# Patient Record
Sex: Female | Born: 1945
Health system: Southern US, Community
[De-identification: ages and names within clinical notes are randomized; demographics above are authoritative.]

## PROBLEM LIST (undated history)

## (undated) DIAGNOSIS — I10 Essential (primary) hypertension: Secondary | ICD-10-CM

## (undated) DIAGNOSIS — T8859XA Other complications of anesthesia, initial encounter: Secondary | ICD-10-CM

## (undated) DIAGNOSIS — K759 Inflammatory liver disease, unspecified: Secondary | ICD-10-CM

## (undated) DIAGNOSIS — M1990 Unspecified osteoarthritis, unspecified site: Secondary | ICD-10-CM

## (undated) DIAGNOSIS — F419 Anxiety disorder, unspecified: Secondary | ICD-10-CM

## (undated) HISTORY — PX: EYE SURGERY: SHX253

## (undated) HISTORY — PX: TONSILLECTOMY: SUR1361

## (undated) HISTORY — PX: ABDOMINAL HYSTERECTOMY: SHX81

---

## 2000-01-10 ENCOUNTER — Other Ambulatory Visit: Admission: RE | Admit: 2000-01-10 | Discharge: 2000-01-10 | Payer: Self-pay | Admitting: Internal Medicine

## 2012-08-23 ENCOUNTER — Other Ambulatory Visit (HOSPITAL_COMMUNITY): Payer: Self-pay | Admitting: Family Medicine

## 2012-08-23 DIAGNOSIS — Z1231 Encounter for screening mammogram for malignant neoplasm of breast: Secondary | ICD-10-CM

## 2012-09-14 ENCOUNTER — Ambulatory Visit (HOSPITAL_COMMUNITY)
Admission: RE | Admit: 2012-09-14 | Discharge: 2012-09-14 | Disposition: A | Discharge status: Home / Self Care | Payer: Medicare Other | Source: Ambulatory Visit | Attending: Family Medicine | Admitting: Family Medicine

## 2012-09-14 DIAGNOSIS — Z1231 Encounter for screening mammogram for malignant neoplasm of breast: Secondary | ICD-10-CM | POA: Insufficient documentation

## 2014-10-30 ENCOUNTER — Other Ambulatory Visit (HOSPITAL_COMMUNITY): Payer: Self-pay | Admitting: Family Medicine

## 2014-10-30 DIAGNOSIS — M8580 Other specified disorders of bone density and structure, unspecified site: Secondary | ICD-10-CM

## 2014-10-30 DIAGNOSIS — Z1231 Encounter for screening mammogram for malignant neoplasm of breast: Secondary | ICD-10-CM

## 2014-10-30 DIAGNOSIS — E049 Nontoxic goiter, unspecified: Secondary | ICD-10-CM

## 2014-11-07 ENCOUNTER — Ambulatory Visit (HOSPITAL_COMMUNITY)
Admission: RE | Admit: 2014-11-07 | Discharge: 2014-11-07 | Disposition: A | Discharge status: Home / Self Care | Payer: Medicare Other | Source: Ambulatory Visit | Attending: Family Medicine | Admitting: Family Medicine

## 2014-11-07 DIAGNOSIS — Z1231 Encounter for screening mammogram for malignant neoplasm of breast: Secondary | ICD-10-CM

## 2014-11-07 DIAGNOSIS — M8580 Other specified disorders of bone density and structure, unspecified site: Secondary | ICD-10-CM | POA: Diagnosis not present

## 2014-11-07 DIAGNOSIS — E049 Nontoxic goiter, unspecified: Secondary | ICD-10-CM

## 2014-12-09 ENCOUNTER — Other Ambulatory Visit (INDEPENDENT_AMBULATORY_CARE_PROVIDER_SITE_OTHER): Payer: Self-pay

## 2014-12-09 DIAGNOSIS — E041 Nontoxic single thyroid nodule: Secondary | ICD-10-CM

## 2014-12-10 ENCOUNTER — Other Ambulatory Visit (INDEPENDENT_AMBULATORY_CARE_PROVIDER_SITE_OTHER): Payer: Self-pay

## 2014-12-10 DIAGNOSIS — E041 Nontoxic single thyroid nodule: Secondary | ICD-10-CM

## 2014-12-11 ENCOUNTER — Other Ambulatory Visit (INDEPENDENT_AMBULATORY_CARE_PROVIDER_SITE_OTHER): Payer: Self-pay | Admitting: *Deleted

## 2014-12-11 DIAGNOSIS — E041 Nontoxic single thyroid nodule: Secondary | ICD-10-CM

## 2014-12-24 ENCOUNTER — Ambulatory Visit
Admission: RE | Admit: 2014-12-24 | Discharge: 2014-12-24 | Disposition: A | Discharge status: Home / Self Care | Payer: Medicare Other | Source: Ambulatory Visit | Attending: General Surgery | Admitting: General Surgery

## 2014-12-24 ENCOUNTER — Ambulatory Visit: Admission: RE | Admit: 2014-12-24 | Payer: Medicare Other | Source: Ambulatory Visit

## 2014-12-24 ENCOUNTER — Other Ambulatory Visit (HOSPITAL_COMMUNITY)
Admission: RE | Admit: 2014-12-24 | Discharge: 2014-12-24 | Disposition: A | Discharge status: Home / Self Care | Payer: Medicare Other | Source: Ambulatory Visit | Attending: Interventional Radiology | Admitting: Interventional Radiology

## 2014-12-24 DIAGNOSIS — E041 Nontoxic single thyroid nodule: Secondary | ICD-10-CM | POA: Insufficient documentation

## 2015-03-02 ENCOUNTER — Other Ambulatory Visit (INDEPENDENT_AMBULATORY_CARE_PROVIDER_SITE_OTHER): Payer: Self-pay

## 2016-11-05 DIAGNOSIS — R69 Illness, unspecified: Secondary | ICD-10-CM | POA: Diagnosis not present

## 2017-01-16 DIAGNOSIS — M62830 Muscle spasm of back: Secondary | ICD-10-CM | POA: Diagnosis not present

## 2017-01-16 DIAGNOSIS — M545 Low back pain: Secondary | ICD-10-CM | POA: Diagnosis not present

## 2017-01-16 DIAGNOSIS — M5136 Other intervertebral disc degeneration, lumbar region: Secondary | ICD-10-CM | POA: Diagnosis not present

## 2017-01-16 DIAGNOSIS — M9903 Segmental and somatic dysfunction of lumbar region: Secondary | ICD-10-CM | POA: Diagnosis not present

## 2017-01-18 DIAGNOSIS — M545 Low back pain: Secondary | ICD-10-CM | POA: Diagnosis not present

## 2017-01-18 DIAGNOSIS — M5136 Other intervertebral disc degeneration, lumbar region: Secondary | ICD-10-CM | POA: Diagnosis not present

## 2017-01-18 DIAGNOSIS — M62830 Muscle spasm of back: Secondary | ICD-10-CM | POA: Diagnosis not present

## 2017-01-18 DIAGNOSIS — M9903 Segmental and somatic dysfunction of lumbar region: Secondary | ICD-10-CM | POA: Diagnosis not present

## 2017-01-19 DIAGNOSIS — M545 Low back pain: Secondary | ICD-10-CM | POA: Diagnosis not present

## 2017-01-19 DIAGNOSIS — M5136 Other intervertebral disc degeneration, lumbar region: Secondary | ICD-10-CM | POA: Diagnosis not present

## 2017-01-19 DIAGNOSIS — M62830 Muscle spasm of back: Secondary | ICD-10-CM | POA: Diagnosis not present

## 2017-01-19 DIAGNOSIS — M9903 Segmental and somatic dysfunction of lumbar region: Secondary | ICD-10-CM | POA: Diagnosis not present

## 2017-01-23 DIAGNOSIS — M5136 Other intervertebral disc degeneration, lumbar region: Secondary | ICD-10-CM | POA: Diagnosis not present

## 2017-01-23 DIAGNOSIS — M9903 Segmental and somatic dysfunction of lumbar region: Secondary | ICD-10-CM | POA: Diagnosis not present

## 2017-01-23 DIAGNOSIS — M62830 Muscle spasm of back: Secondary | ICD-10-CM | POA: Diagnosis not present

## 2017-01-23 DIAGNOSIS — M545 Low back pain: Secondary | ICD-10-CM | POA: Diagnosis not present

## 2017-01-25 DIAGNOSIS — M9903 Segmental and somatic dysfunction of lumbar region: Secondary | ICD-10-CM | POA: Diagnosis not present

## 2017-01-25 DIAGNOSIS — M5136 Other intervertebral disc degeneration, lumbar region: Secondary | ICD-10-CM | POA: Diagnosis not present

## 2017-01-25 DIAGNOSIS — M62830 Muscle spasm of back: Secondary | ICD-10-CM | POA: Diagnosis not present

## 2017-01-25 DIAGNOSIS — M545 Low back pain: Secondary | ICD-10-CM | POA: Diagnosis not present

## 2017-01-26 DIAGNOSIS — M9903 Segmental and somatic dysfunction of lumbar region: Secondary | ICD-10-CM | POA: Diagnosis not present

## 2017-01-26 DIAGNOSIS — M5136 Other intervertebral disc degeneration, lumbar region: Secondary | ICD-10-CM | POA: Diagnosis not present

## 2017-01-26 DIAGNOSIS — M62830 Muscle spasm of back: Secondary | ICD-10-CM | POA: Diagnosis not present

## 2017-01-26 DIAGNOSIS — M545 Low back pain: Secondary | ICD-10-CM | POA: Diagnosis not present

## 2017-01-30 DIAGNOSIS — M545 Low back pain: Secondary | ICD-10-CM | POA: Diagnosis not present

## 2017-01-30 DIAGNOSIS — M5136 Other intervertebral disc degeneration, lumbar region: Secondary | ICD-10-CM | POA: Diagnosis not present

## 2017-01-30 DIAGNOSIS — M9903 Segmental and somatic dysfunction of lumbar region: Secondary | ICD-10-CM | POA: Diagnosis not present

## 2017-01-30 DIAGNOSIS — M62830 Muscle spasm of back: Secondary | ICD-10-CM | POA: Diagnosis not present

## 2017-02-01 DIAGNOSIS — M9903 Segmental and somatic dysfunction of lumbar region: Secondary | ICD-10-CM | POA: Diagnosis not present

## 2017-02-01 DIAGNOSIS — M5136 Other intervertebral disc degeneration, lumbar region: Secondary | ICD-10-CM | POA: Diagnosis not present

## 2017-02-01 DIAGNOSIS — M545 Low back pain: Secondary | ICD-10-CM | POA: Diagnosis not present

## 2017-02-01 DIAGNOSIS — M62830 Muscle spasm of back: Secondary | ICD-10-CM | POA: Diagnosis not present

## 2017-02-02 DIAGNOSIS — M5136 Other intervertebral disc degeneration, lumbar region: Secondary | ICD-10-CM | POA: Diagnosis not present

## 2017-02-02 DIAGNOSIS — M545 Low back pain: Secondary | ICD-10-CM | POA: Diagnosis not present

## 2017-02-02 DIAGNOSIS — M62830 Muscle spasm of back: Secondary | ICD-10-CM | POA: Diagnosis not present

## 2017-02-02 DIAGNOSIS — M9903 Segmental and somatic dysfunction of lumbar region: Secondary | ICD-10-CM | POA: Diagnosis not present

## 2017-02-06 DIAGNOSIS — M9903 Segmental and somatic dysfunction of lumbar region: Secondary | ICD-10-CM | POA: Diagnosis not present

## 2017-02-06 DIAGNOSIS — M62830 Muscle spasm of back: Secondary | ICD-10-CM | POA: Diagnosis not present

## 2017-02-06 DIAGNOSIS — M5136 Other intervertebral disc degeneration, lumbar region: Secondary | ICD-10-CM | POA: Diagnosis not present

## 2017-02-06 DIAGNOSIS — M545 Low back pain: Secondary | ICD-10-CM | POA: Diagnosis not present

## 2017-02-09 DIAGNOSIS — M5136 Other intervertebral disc degeneration, lumbar region: Secondary | ICD-10-CM | POA: Diagnosis not present

## 2017-02-09 DIAGNOSIS — M545 Low back pain: Secondary | ICD-10-CM | POA: Diagnosis not present

## 2017-02-09 DIAGNOSIS — M62830 Muscle spasm of back: Secondary | ICD-10-CM | POA: Diagnosis not present

## 2017-02-09 DIAGNOSIS — M9903 Segmental and somatic dysfunction of lumbar region: Secondary | ICD-10-CM | POA: Diagnosis not present

## 2017-02-10 DIAGNOSIS — M791 Myalgia: Secondary | ICD-10-CM | POA: Diagnosis not present

## 2017-02-10 DIAGNOSIS — E042 Nontoxic multinodular goiter: Secondary | ICD-10-CM | POA: Diagnosis not present

## 2017-02-10 DIAGNOSIS — R69 Illness, unspecified: Secondary | ICD-10-CM | POA: Diagnosis not present

## 2017-02-10 DIAGNOSIS — Z6829 Body mass index (BMI) 29.0-29.9, adult: Secondary | ICD-10-CM | POA: Diagnosis not present

## 2017-02-10 DIAGNOSIS — M542 Cervicalgia: Secondary | ICD-10-CM | POA: Diagnosis not present

## 2017-02-10 DIAGNOSIS — I10 Essential (primary) hypertension: Secondary | ICD-10-CM | POA: Diagnosis not present

## 2017-02-10 DIAGNOSIS — Z0000 Encounter for general adult medical examination without abnormal findings: Secondary | ICD-10-CM | POA: Diagnosis not present

## 2017-02-10 DIAGNOSIS — Z79899 Other long term (current) drug therapy: Secondary | ICD-10-CM | POA: Diagnosis not present

## 2017-02-10 DIAGNOSIS — Z7982 Long term (current) use of aspirin: Secondary | ICD-10-CM | POA: Diagnosis not present

## 2017-02-10 DIAGNOSIS — M545 Low back pain: Secondary | ICD-10-CM | POA: Diagnosis not present

## 2017-02-11 DIAGNOSIS — M9903 Segmental and somatic dysfunction of lumbar region: Secondary | ICD-10-CM | POA: Diagnosis not present

## 2017-02-11 DIAGNOSIS — M5136 Other intervertebral disc degeneration, lumbar region: Secondary | ICD-10-CM | POA: Diagnosis not present

## 2017-02-11 DIAGNOSIS — M62830 Muscle spasm of back: Secondary | ICD-10-CM | POA: Diagnosis not present

## 2017-02-11 DIAGNOSIS — M545 Low back pain: Secondary | ICD-10-CM | POA: Diagnosis not present

## 2017-02-14 DIAGNOSIS — M545 Low back pain: Secondary | ICD-10-CM | POA: Diagnosis not present

## 2017-02-14 DIAGNOSIS — M9903 Segmental and somatic dysfunction of lumbar region: Secondary | ICD-10-CM | POA: Diagnosis not present

## 2017-02-14 DIAGNOSIS — M5136 Other intervertebral disc degeneration, lumbar region: Secondary | ICD-10-CM | POA: Diagnosis not present

## 2017-02-14 DIAGNOSIS — M62830 Muscle spasm of back: Secondary | ICD-10-CM | POA: Diagnosis not present

## 2017-02-16 DIAGNOSIS — M9903 Segmental and somatic dysfunction of lumbar region: Secondary | ICD-10-CM | POA: Diagnosis not present

## 2017-02-16 DIAGNOSIS — M545 Low back pain: Secondary | ICD-10-CM | POA: Diagnosis not present

## 2017-02-16 DIAGNOSIS — M62830 Muscle spasm of back: Secondary | ICD-10-CM | POA: Diagnosis not present

## 2017-02-16 DIAGNOSIS — M5136 Other intervertebral disc degeneration, lumbar region: Secondary | ICD-10-CM | POA: Diagnosis not present

## 2017-02-21 DIAGNOSIS — M545 Low back pain: Secondary | ICD-10-CM | POA: Diagnosis not present

## 2017-02-21 DIAGNOSIS — M5136 Other intervertebral disc degeneration, lumbar region: Secondary | ICD-10-CM | POA: Diagnosis not present

## 2017-02-21 DIAGNOSIS — M62830 Muscle spasm of back: Secondary | ICD-10-CM | POA: Diagnosis not present

## 2017-02-21 DIAGNOSIS — M9903 Segmental and somatic dysfunction of lumbar region: Secondary | ICD-10-CM | POA: Diagnosis not present

## 2017-02-23 DIAGNOSIS — M62830 Muscle spasm of back: Secondary | ICD-10-CM | POA: Diagnosis not present

## 2017-02-23 DIAGNOSIS — M545 Low back pain: Secondary | ICD-10-CM | POA: Diagnosis not present

## 2017-02-23 DIAGNOSIS — M9903 Segmental and somatic dysfunction of lumbar region: Secondary | ICD-10-CM | POA: Diagnosis not present

## 2017-02-23 DIAGNOSIS — M5136 Other intervertebral disc degeneration, lumbar region: Secondary | ICD-10-CM | POA: Diagnosis not present

## 2017-02-28 DIAGNOSIS — M62830 Muscle spasm of back: Secondary | ICD-10-CM | POA: Diagnosis not present

## 2017-02-28 DIAGNOSIS — M9903 Segmental and somatic dysfunction of lumbar region: Secondary | ICD-10-CM | POA: Diagnosis not present

## 2017-02-28 DIAGNOSIS — M5136 Other intervertebral disc degeneration, lumbar region: Secondary | ICD-10-CM | POA: Diagnosis not present

## 2017-02-28 DIAGNOSIS — M545 Low back pain: Secondary | ICD-10-CM | POA: Diagnosis not present

## 2017-03-02 DIAGNOSIS — M545 Low back pain: Secondary | ICD-10-CM | POA: Diagnosis not present

## 2017-03-02 DIAGNOSIS — M5136 Other intervertebral disc degeneration, lumbar region: Secondary | ICD-10-CM | POA: Diagnosis not present

## 2017-03-02 DIAGNOSIS — M62830 Muscle spasm of back: Secondary | ICD-10-CM | POA: Diagnosis not present

## 2017-03-02 DIAGNOSIS — M9903 Segmental and somatic dysfunction of lumbar region: Secondary | ICD-10-CM | POA: Diagnosis not present

## 2017-03-07 DIAGNOSIS — M62830 Muscle spasm of back: Secondary | ICD-10-CM | POA: Diagnosis not present

## 2017-03-07 DIAGNOSIS — M5136 Other intervertebral disc degeneration, lumbar region: Secondary | ICD-10-CM | POA: Diagnosis not present

## 2017-03-07 DIAGNOSIS — M545 Low back pain: Secondary | ICD-10-CM | POA: Diagnosis not present

## 2017-03-07 DIAGNOSIS — M9903 Segmental and somatic dysfunction of lumbar region: Secondary | ICD-10-CM | POA: Diagnosis not present

## 2017-03-09 DIAGNOSIS — M62830 Muscle spasm of back: Secondary | ICD-10-CM | POA: Diagnosis not present

## 2017-03-09 DIAGNOSIS — M545 Low back pain: Secondary | ICD-10-CM | POA: Diagnosis not present

## 2017-03-09 DIAGNOSIS — M5136 Other intervertebral disc degeneration, lumbar region: Secondary | ICD-10-CM | POA: Diagnosis not present

## 2017-03-09 DIAGNOSIS — M9903 Segmental and somatic dysfunction of lumbar region: Secondary | ICD-10-CM | POA: Diagnosis not present

## 2017-03-14 DIAGNOSIS — M9903 Segmental and somatic dysfunction of lumbar region: Secondary | ICD-10-CM | POA: Diagnosis not present

## 2017-03-14 DIAGNOSIS — M62830 Muscle spasm of back: Secondary | ICD-10-CM | POA: Diagnosis not present

## 2017-03-14 DIAGNOSIS — M545 Low back pain: Secondary | ICD-10-CM | POA: Diagnosis not present

## 2017-03-14 DIAGNOSIS — M5136 Other intervertebral disc degeneration, lumbar region: Secondary | ICD-10-CM | POA: Diagnosis not present

## 2017-03-16 DIAGNOSIS — M9903 Segmental and somatic dysfunction of lumbar region: Secondary | ICD-10-CM | POA: Diagnosis not present

## 2017-03-16 DIAGNOSIS — M545 Low back pain: Secondary | ICD-10-CM | POA: Diagnosis not present

## 2017-03-16 DIAGNOSIS — M62830 Muscle spasm of back: Secondary | ICD-10-CM | POA: Diagnosis not present

## 2017-03-16 DIAGNOSIS — M5136 Other intervertebral disc degeneration, lumbar region: Secondary | ICD-10-CM | POA: Diagnosis not present

## 2017-03-21 DIAGNOSIS — M5136 Other intervertebral disc degeneration, lumbar region: Secondary | ICD-10-CM | POA: Diagnosis not present

## 2017-03-21 DIAGNOSIS — M9903 Segmental and somatic dysfunction of lumbar region: Secondary | ICD-10-CM | POA: Diagnosis not present

## 2017-03-21 DIAGNOSIS — M545 Low back pain: Secondary | ICD-10-CM | POA: Diagnosis not present

## 2017-03-21 DIAGNOSIS — M62830 Muscle spasm of back: Secondary | ICD-10-CM | POA: Diagnosis not present

## 2017-03-22 DIAGNOSIS — H353132 Nonexudative age-related macular degeneration, bilateral, intermediate dry stage: Secondary | ICD-10-CM | POA: Diagnosis not present

## 2017-03-22 DIAGNOSIS — H524 Presbyopia: Secondary | ICD-10-CM | POA: Diagnosis not present

## 2017-03-23 DIAGNOSIS — M9903 Segmental and somatic dysfunction of lumbar region: Secondary | ICD-10-CM | POA: Diagnosis not present

## 2017-03-23 DIAGNOSIS — M545 Low back pain: Secondary | ICD-10-CM | POA: Diagnosis not present

## 2017-03-23 DIAGNOSIS — M5136 Other intervertebral disc degeneration, lumbar region: Secondary | ICD-10-CM | POA: Diagnosis not present

## 2017-03-23 DIAGNOSIS — M62830 Muscle spasm of back: Secondary | ICD-10-CM | POA: Diagnosis not present

## 2017-03-28 DIAGNOSIS — M5136 Other intervertebral disc degeneration, lumbar region: Secondary | ICD-10-CM | POA: Diagnosis not present

## 2017-03-28 DIAGNOSIS — M545 Low back pain: Secondary | ICD-10-CM | POA: Diagnosis not present

## 2017-03-28 DIAGNOSIS — M62830 Muscle spasm of back: Secondary | ICD-10-CM | POA: Diagnosis not present

## 2017-03-28 DIAGNOSIS — M9903 Segmental and somatic dysfunction of lumbar region: Secondary | ICD-10-CM | POA: Diagnosis not present

## 2017-03-30 DIAGNOSIS — M9903 Segmental and somatic dysfunction of lumbar region: Secondary | ICD-10-CM | POA: Diagnosis not present

## 2017-03-30 DIAGNOSIS — M19012 Primary osteoarthritis, left shoulder: Secondary | ICD-10-CM | POA: Diagnosis not present

## 2017-03-30 DIAGNOSIS — M5136 Other intervertebral disc degeneration, lumbar region: Secondary | ICD-10-CM | POA: Diagnosis not present

## 2017-03-30 DIAGNOSIS — M545 Low back pain: Secondary | ICD-10-CM | POA: Diagnosis not present

## 2017-03-30 DIAGNOSIS — M62830 Muscle spasm of back: Secondary | ICD-10-CM | POA: Diagnosis not present

## 2017-04-02 DIAGNOSIS — J209 Acute bronchitis, unspecified: Secondary | ICD-10-CM | POA: Diagnosis not present

## 2017-04-02 DIAGNOSIS — R69 Illness, unspecified: Secondary | ICD-10-CM | POA: Diagnosis not present

## 2017-04-02 DIAGNOSIS — R0789 Other chest pain: Secondary | ICD-10-CM | POA: Diagnosis not present

## 2017-04-02 DIAGNOSIS — I10 Essential (primary) hypertension: Secondary | ICD-10-CM | POA: Diagnosis not present

## 2017-04-03 DIAGNOSIS — M9903 Segmental and somatic dysfunction of lumbar region: Secondary | ICD-10-CM | POA: Diagnosis not present

## 2017-04-03 DIAGNOSIS — M545 Low back pain: Secondary | ICD-10-CM | POA: Diagnosis not present

## 2017-04-03 DIAGNOSIS — M62830 Muscle spasm of back: Secondary | ICD-10-CM | POA: Diagnosis not present

## 2017-04-03 DIAGNOSIS — M5136 Other intervertebral disc degeneration, lumbar region: Secondary | ICD-10-CM | POA: Diagnosis not present

## 2017-04-04 DIAGNOSIS — E559 Vitamin D deficiency, unspecified: Secondary | ICD-10-CM | POA: Diagnosis not present

## 2017-04-04 DIAGNOSIS — Z23 Encounter for immunization: Secondary | ICD-10-CM | POA: Diagnosis not present

## 2017-04-04 DIAGNOSIS — Z1329 Encounter for screening for other suspected endocrine disorder: Secondary | ICD-10-CM | POA: Diagnosis not present

## 2017-04-04 DIAGNOSIS — Z0000 Encounter for general adult medical examination without abnormal findings: Secondary | ICD-10-CM | POA: Diagnosis not present

## 2017-04-04 DIAGNOSIS — Z136 Encounter for screening for cardiovascular disorders: Secondary | ICD-10-CM | POA: Diagnosis not present

## 2017-04-04 DIAGNOSIS — J209 Acute bronchitis, unspecified: Secondary | ICD-10-CM | POA: Diagnosis not present

## 2017-04-04 DIAGNOSIS — Z131 Encounter for screening for diabetes mellitus: Secondary | ICD-10-CM | POA: Diagnosis not present

## 2017-04-04 DIAGNOSIS — Z1231 Encounter for screening mammogram for malignant neoplasm of breast: Secondary | ICD-10-CM | POA: Diagnosis not present

## 2017-04-04 DIAGNOSIS — I10 Essential (primary) hypertension: Secondary | ICD-10-CM | POA: Diagnosis not present

## 2017-04-04 DIAGNOSIS — M8580 Other specified disorders of bone density and structure, unspecified site: Secondary | ICD-10-CM | POA: Diagnosis not present

## 2017-04-05 ENCOUNTER — Other Ambulatory Visit (HOSPITAL_BASED_OUTPATIENT_CLINIC_OR_DEPARTMENT_OTHER): Payer: Self-pay | Admitting: Internal Medicine

## 2017-04-05 DIAGNOSIS — M8588 Other specified disorders of bone density and structure, other site: Secondary | ICD-10-CM

## 2017-04-05 DIAGNOSIS — Z1231 Encounter for screening mammogram for malignant neoplasm of breast: Secondary | ICD-10-CM

## 2017-04-11 DIAGNOSIS — M62830 Muscle spasm of back: Secondary | ICD-10-CM | POA: Diagnosis not present

## 2017-04-11 DIAGNOSIS — M9903 Segmental and somatic dysfunction of lumbar region: Secondary | ICD-10-CM | POA: Diagnosis not present

## 2017-04-11 DIAGNOSIS — M545 Low back pain: Secondary | ICD-10-CM | POA: Diagnosis not present

## 2017-04-11 DIAGNOSIS — M5136 Other intervertebral disc degeneration, lumbar region: Secondary | ICD-10-CM | POA: Diagnosis not present

## 2017-04-26 DIAGNOSIS — M62830 Muscle spasm of back: Secondary | ICD-10-CM | POA: Diagnosis not present

## 2017-04-26 DIAGNOSIS — M545 Low back pain: Secondary | ICD-10-CM | POA: Diagnosis not present

## 2017-04-26 DIAGNOSIS — M5136 Other intervertebral disc degeneration, lumbar region: Secondary | ICD-10-CM | POA: Diagnosis not present

## 2017-04-26 DIAGNOSIS — M9903 Segmental and somatic dysfunction of lumbar region: Secondary | ICD-10-CM | POA: Diagnosis not present

## 2017-05-15 DIAGNOSIS — M62830 Muscle spasm of back: Secondary | ICD-10-CM | POA: Diagnosis not present

## 2017-05-15 DIAGNOSIS — M545 Low back pain: Secondary | ICD-10-CM | POA: Diagnosis not present

## 2017-05-15 DIAGNOSIS — M5136 Other intervertebral disc degeneration, lumbar region: Secondary | ICD-10-CM | POA: Diagnosis not present

## 2017-05-15 DIAGNOSIS — M9903 Segmental and somatic dysfunction of lumbar region: Secondary | ICD-10-CM | POA: Diagnosis not present

## 2017-05-29 DIAGNOSIS — M9903 Segmental and somatic dysfunction of lumbar region: Secondary | ICD-10-CM | POA: Diagnosis not present

## 2017-05-29 DIAGNOSIS — M62830 Muscle spasm of back: Secondary | ICD-10-CM | POA: Diagnosis not present

## 2017-05-29 DIAGNOSIS — M5136 Other intervertebral disc degeneration, lumbar region: Secondary | ICD-10-CM | POA: Diagnosis not present

## 2017-05-29 DIAGNOSIS — M545 Low back pain: Secondary | ICD-10-CM | POA: Diagnosis not present

## 2017-06-15 ENCOUNTER — Encounter (HOSPITAL_BASED_OUTPATIENT_CLINIC_OR_DEPARTMENT_OTHER): Payer: Self-pay

## 2017-06-15 ENCOUNTER — Ambulatory Visit (HOSPITAL_BASED_OUTPATIENT_CLINIC_OR_DEPARTMENT_OTHER)
Admission: RE | Admit: 2017-06-15 | Discharge: 2017-06-15 | Disposition: A | Discharge status: Home / Self Care | Payer: Medicare HMO | Source: Ambulatory Visit | Attending: Internal Medicine | Admitting: Internal Medicine

## 2017-06-15 DIAGNOSIS — M85851 Other specified disorders of bone density and structure, right thigh: Secondary | ICD-10-CM | POA: Insufficient documentation

## 2017-06-15 DIAGNOSIS — Z1231 Encounter for screening mammogram for malignant neoplasm of breast: Secondary | ICD-10-CM | POA: Diagnosis not present

## 2017-06-15 DIAGNOSIS — M8588 Other specified disorders of bone density and structure, other site: Secondary | ICD-10-CM

## 2017-06-15 DIAGNOSIS — Z780 Asymptomatic menopausal state: Secondary | ICD-10-CM | POA: Diagnosis not present

## 2017-06-30 DIAGNOSIS — M62830 Muscle spasm of back: Secondary | ICD-10-CM | POA: Diagnosis not present

## 2017-06-30 DIAGNOSIS — M5136 Other intervertebral disc degeneration, lumbar region: Secondary | ICD-10-CM | POA: Diagnosis not present

## 2017-06-30 DIAGNOSIS — M9903 Segmental and somatic dysfunction of lumbar region: Secondary | ICD-10-CM | POA: Diagnosis not present

## 2017-06-30 DIAGNOSIS — M545 Low back pain: Secondary | ICD-10-CM | POA: Diagnosis not present

## 2017-07-03 DIAGNOSIS — M5136 Other intervertebral disc degeneration, lumbar region: Secondary | ICD-10-CM | POA: Diagnosis not present

## 2017-07-03 DIAGNOSIS — M9903 Segmental and somatic dysfunction of lumbar region: Secondary | ICD-10-CM | POA: Diagnosis not present

## 2017-07-03 DIAGNOSIS — M62830 Muscle spasm of back: Secondary | ICD-10-CM | POA: Diagnosis not present

## 2017-07-03 DIAGNOSIS — M545 Low back pain: Secondary | ICD-10-CM | POA: Diagnosis not present

## 2017-07-10 DIAGNOSIS — M545 Low back pain: Secondary | ICD-10-CM | POA: Diagnosis not present

## 2017-07-10 DIAGNOSIS — M5136 Other intervertebral disc degeneration, lumbar region: Secondary | ICD-10-CM | POA: Diagnosis not present

## 2017-07-10 DIAGNOSIS — M62830 Muscle spasm of back: Secondary | ICD-10-CM | POA: Diagnosis not present

## 2017-07-10 DIAGNOSIS — M9903 Segmental and somatic dysfunction of lumbar region: Secondary | ICD-10-CM | POA: Diagnosis not present

## 2017-07-19 DIAGNOSIS — M545 Low back pain: Secondary | ICD-10-CM | POA: Diagnosis not present

## 2017-07-19 DIAGNOSIS — M62830 Muscle spasm of back: Secondary | ICD-10-CM | POA: Diagnosis not present

## 2017-07-19 DIAGNOSIS — M5136 Other intervertebral disc degeneration, lumbar region: Secondary | ICD-10-CM | POA: Diagnosis not present

## 2017-07-19 DIAGNOSIS — M9903 Segmental and somatic dysfunction of lumbar region: Secondary | ICD-10-CM | POA: Diagnosis not present

## 2017-08-01 DIAGNOSIS — M5136 Other intervertebral disc degeneration, lumbar region: Secondary | ICD-10-CM | POA: Diagnosis not present

## 2017-08-01 DIAGNOSIS — M62830 Muscle spasm of back: Secondary | ICD-10-CM | POA: Diagnosis not present

## 2017-08-01 DIAGNOSIS — M545 Low back pain: Secondary | ICD-10-CM | POA: Diagnosis not present

## 2017-08-01 DIAGNOSIS — M9903 Segmental and somatic dysfunction of lumbar region: Secondary | ICD-10-CM | POA: Diagnosis not present

## 2017-08-07 DIAGNOSIS — M62830 Muscle spasm of back: Secondary | ICD-10-CM | POA: Diagnosis not present

## 2017-08-07 DIAGNOSIS — M545 Low back pain: Secondary | ICD-10-CM | POA: Diagnosis not present

## 2017-08-07 DIAGNOSIS — M9903 Segmental and somatic dysfunction of lumbar region: Secondary | ICD-10-CM | POA: Diagnosis not present

## 2017-08-07 DIAGNOSIS — M5136 Other intervertebral disc degeneration, lumbar region: Secondary | ICD-10-CM | POA: Diagnosis not present

## 2017-08-08 DIAGNOSIS — M19012 Primary osteoarthritis, left shoulder: Secondary | ICD-10-CM | POA: Diagnosis not present

## 2017-08-17 DIAGNOSIS — M545 Low back pain: Secondary | ICD-10-CM | POA: Diagnosis not present

## 2017-08-17 DIAGNOSIS — M5136 Other intervertebral disc degeneration, lumbar region: Secondary | ICD-10-CM | POA: Diagnosis not present

## 2017-08-17 DIAGNOSIS — M62830 Muscle spasm of back: Secondary | ICD-10-CM | POA: Diagnosis not present

## 2017-08-17 DIAGNOSIS — M9903 Segmental and somatic dysfunction of lumbar region: Secondary | ICD-10-CM | POA: Diagnosis not present

## 2017-08-29 DIAGNOSIS — M62830 Muscle spasm of back: Secondary | ICD-10-CM | POA: Diagnosis not present

## 2017-08-29 DIAGNOSIS — M9903 Segmental and somatic dysfunction of lumbar region: Secondary | ICD-10-CM | POA: Diagnosis not present

## 2017-08-29 DIAGNOSIS — M545 Low back pain: Secondary | ICD-10-CM | POA: Diagnosis not present

## 2017-08-29 DIAGNOSIS — M5136 Other intervertebral disc degeneration, lumbar region: Secondary | ICD-10-CM | POA: Diagnosis not present

## 2017-09-12 DIAGNOSIS — R94112 Abnormal visually evoked potential [VEP]: Secondary | ICD-10-CM | POA: Diagnosis not present

## 2017-09-12 DIAGNOSIS — H353123 Nonexudative age-related macular degeneration, left eye, advanced atrophic without subfoveal involvement: Secondary | ICD-10-CM | POA: Diagnosis not present

## 2017-09-12 DIAGNOSIS — Z961 Presence of intraocular lens: Secondary | ICD-10-CM | POA: Diagnosis not present

## 2017-09-12 DIAGNOSIS — H353113 Nonexudative age-related macular degeneration, right eye, advanced atrophic without subfoveal involvement: Secondary | ICD-10-CM | POA: Diagnosis not present

## 2017-09-13 DIAGNOSIS — B029 Zoster without complications: Secondary | ICD-10-CM | POA: Diagnosis not present

## 2017-09-27 DIAGNOSIS — Z1159 Encounter for screening for other viral diseases: Secondary | ICD-10-CM | POA: Diagnosis not present

## 2017-09-27 DIAGNOSIS — I10 Essential (primary) hypertension: Secondary | ICD-10-CM | POA: Diagnosis not present

## 2017-09-27 DIAGNOSIS — B0229 Other postherpetic nervous system involvement: Secondary | ICD-10-CM | POA: Diagnosis not present

## 2017-09-27 DIAGNOSIS — E559 Vitamin D deficiency, unspecified: Secondary | ICD-10-CM | POA: Diagnosis not present

## 2017-09-28 DIAGNOSIS — M545 Low back pain: Secondary | ICD-10-CM | POA: Diagnosis not present

## 2017-09-28 DIAGNOSIS — M62830 Muscle spasm of back: Secondary | ICD-10-CM | POA: Diagnosis not present

## 2017-09-28 DIAGNOSIS — M5136 Other intervertebral disc degeneration, lumbar region: Secondary | ICD-10-CM | POA: Diagnosis not present

## 2017-09-28 DIAGNOSIS — M9903 Segmental and somatic dysfunction of lumbar region: Secondary | ICD-10-CM | POA: Diagnosis not present

## 2017-10-05 DIAGNOSIS — M545 Low back pain: Secondary | ICD-10-CM | POA: Diagnosis not present

## 2017-10-05 DIAGNOSIS — M5136 Other intervertebral disc degeneration, lumbar region: Secondary | ICD-10-CM | POA: Diagnosis not present

## 2017-10-05 DIAGNOSIS — M9903 Segmental and somatic dysfunction of lumbar region: Secondary | ICD-10-CM | POA: Diagnosis not present

## 2017-10-05 DIAGNOSIS — M62830 Muscle spasm of back: Secondary | ICD-10-CM | POA: Diagnosis not present

## 2017-10-19 DIAGNOSIS — M9903 Segmental and somatic dysfunction of lumbar region: Secondary | ICD-10-CM | POA: Diagnosis not present

## 2017-10-19 DIAGNOSIS — M62830 Muscle spasm of back: Secondary | ICD-10-CM | POA: Diagnosis not present

## 2017-10-19 DIAGNOSIS — M545 Low back pain: Secondary | ICD-10-CM | POA: Diagnosis not present

## 2017-10-19 DIAGNOSIS — M5136 Other intervertebral disc degeneration, lumbar region: Secondary | ICD-10-CM | POA: Diagnosis not present

## 2017-10-25 DIAGNOSIS — R69 Illness, unspecified: Secondary | ICD-10-CM | POA: Diagnosis not present

## 2017-10-25 DIAGNOSIS — Z8249 Family history of ischemic heart disease and other diseases of the circulatory system: Secondary | ICD-10-CM | POA: Diagnosis not present

## 2017-10-25 DIAGNOSIS — G8929 Other chronic pain: Secondary | ICD-10-CM | POA: Diagnosis not present

## 2017-10-25 DIAGNOSIS — I951 Orthostatic hypotension: Secondary | ICD-10-CM | POA: Diagnosis not present

## 2017-10-25 DIAGNOSIS — Z809 Family history of malignant neoplasm, unspecified: Secondary | ICD-10-CM | POA: Diagnosis not present

## 2017-10-25 DIAGNOSIS — I10 Essential (primary) hypertension: Secondary | ICD-10-CM | POA: Diagnosis not present

## 2017-10-25 DIAGNOSIS — Z7982 Long term (current) use of aspirin: Secondary | ICD-10-CM | POA: Diagnosis not present

## 2017-10-25 DIAGNOSIS — Z833 Family history of diabetes mellitus: Secondary | ICD-10-CM | POA: Diagnosis not present

## 2017-11-09 DIAGNOSIS — M5136 Other intervertebral disc degeneration, lumbar region: Secondary | ICD-10-CM | POA: Diagnosis not present

## 2017-11-09 DIAGNOSIS — M9903 Segmental and somatic dysfunction of lumbar region: Secondary | ICD-10-CM | POA: Diagnosis not present

## 2017-11-09 DIAGNOSIS — M62830 Muscle spasm of back: Secondary | ICD-10-CM | POA: Diagnosis not present

## 2017-11-09 DIAGNOSIS — M545 Low back pain: Secondary | ICD-10-CM | POA: Diagnosis not present

## 2017-11-16 DIAGNOSIS — M545 Low back pain: Secondary | ICD-10-CM | POA: Diagnosis not present

## 2017-11-16 DIAGNOSIS — M9903 Segmental and somatic dysfunction of lumbar region: Secondary | ICD-10-CM | POA: Diagnosis not present

## 2017-11-16 DIAGNOSIS — M5136 Other intervertebral disc degeneration, lumbar region: Secondary | ICD-10-CM | POA: Diagnosis not present

## 2017-11-16 DIAGNOSIS — M62830 Muscle spasm of back: Secondary | ICD-10-CM | POA: Diagnosis not present

## 2017-11-30 DIAGNOSIS — M5136 Other intervertebral disc degeneration, lumbar region: Secondary | ICD-10-CM | POA: Diagnosis not present

## 2017-11-30 DIAGNOSIS — M62830 Muscle spasm of back: Secondary | ICD-10-CM | POA: Diagnosis not present

## 2017-11-30 DIAGNOSIS — M545 Low back pain: Secondary | ICD-10-CM | POA: Diagnosis not present

## 2017-11-30 DIAGNOSIS — M9903 Segmental and somatic dysfunction of lumbar region: Secondary | ICD-10-CM | POA: Diagnosis not present

## 2017-12-14 DIAGNOSIS — M5136 Other intervertebral disc degeneration, lumbar region: Secondary | ICD-10-CM | POA: Diagnosis not present

## 2017-12-14 DIAGNOSIS — M62830 Muscle spasm of back: Secondary | ICD-10-CM | POA: Diagnosis not present

## 2017-12-14 DIAGNOSIS — M545 Low back pain: Secondary | ICD-10-CM | POA: Diagnosis not present

## 2017-12-14 DIAGNOSIS — M9903 Segmental and somatic dysfunction of lumbar region: Secondary | ICD-10-CM | POA: Diagnosis not present

## 2017-12-27 DIAGNOSIS — Z23 Encounter for immunization: Secondary | ICD-10-CM | POA: Diagnosis not present

## 2017-12-27 DIAGNOSIS — R69 Illness, unspecified: Secondary | ICD-10-CM | POA: Diagnosis not present

## 2018-01-02 DIAGNOSIS — M545 Low back pain: Secondary | ICD-10-CM | POA: Diagnosis not present

## 2018-01-02 DIAGNOSIS — M62830 Muscle spasm of back: Secondary | ICD-10-CM | POA: Diagnosis not present

## 2018-01-02 DIAGNOSIS — M9903 Segmental and somatic dysfunction of lumbar region: Secondary | ICD-10-CM | POA: Diagnosis not present

## 2018-01-02 DIAGNOSIS — M5136 Other intervertebral disc degeneration, lumbar region: Secondary | ICD-10-CM | POA: Diagnosis not present

## 2018-01-11 DIAGNOSIS — M9903 Segmental and somatic dysfunction of lumbar region: Secondary | ICD-10-CM | POA: Diagnosis not present

## 2018-01-11 DIAGNOSIS — M5136 Other intervertebral disc degeneration, lumbar region: Secondary | ICD-10-CM | POA: Diagnosis not present

## 2018-01-11 DIAGNOSIS — M545 Low back pain: Secondary | ICD-10-CM | POA: Diagnosis not present

## 2018-01-11 DIAGNOSIS — M62830 Muscle spasm of back: Secondary | ICD-10-CM | POA: Diagnosis not present

## 2018-01-25 DIAGNOSIS — M545 Low back pain: Secondary | ICD-10-CM | POA: Diagnosis not present

## 2018-01-25 DIAGNOSIS — M9903 Segmental and somatic dysfunction of lumbar region: Secondary | ICD-10-CM | POA: Diagnosis not present

## 2018-01-25 DIAGNOSIS — M5136 Other intervertebral disc degeneration, lumbar region: Secondary | ICD-10-CM | POA: Diagnosis not present

## 2018-01-25 DIAGNOSIS — M62830 Muscle spasm of back: Secondary | ICD-10-CM | POA: Diagnosis not present

## 2018-02-22 DIAGNOSIS — M5136 Other intervertebral disc degeneration, lumbar region: Secondary | ICD-10-CM | POA: Diagnosis not present

## 2018-02-22 DIAGNOSIS — M62830 Muscle spasm of back: Secondary | ICD-10-CM | POA: Diagnosis not present

## 2018-02-22 DIAGNOSIS — M9903 Segmental and somatic dysfunction of lumbar region: Secondary | ICD-10-CM | POA: Diagnosis not present

## 2018-02-22 DIAGNOSIS — M545 Low back pain: Secondary | ICD-10-CM | POA: Diagnosis not present

## 2018-03-13 DIAGNOSIS — H353230 Exudative age-related macular degeneration, bilateral, stage unspecified: Secondary | ICD-10-CM | POA: Diagnosis not present

## 2018-03-13 DIAGNOSIS — B379 Candidiasis, unspecified: Secondary | ICD-10-CM | POA: Diagnosis not present

## 2018-04-09 DIAGNOSIS — H353230 Exudative age-related macular degeneration, bilateral, stage unspecified: Secondary | ICD-10-CM | POA: Diagnosis not present

## 2018-04-09 DIAGNOSIS — Z961 Presence of intraocular lens: Secondary | ICD-10-CM | POA: Diagnosis not present

## 2018-04-25 DIAGNOSIS — H43813 Vitreous degeneration, bilateral: Secondary | ICD-10-CM | POA: Diagnosis not present

## 2018-04-25 DIAGNOSIS — H353132 Nonexudative age-related macular degeneration, bilateral, intermediate dry stage: Secondary | ICD-10-CM | POA: Diagnosis not present

## 2018-05-08 DIAGNOSIS — E559 Vitamin D deficiency, unspecified: Secondary | ICD-10-CM | POA: Diagnosis not present

## 2018-05-08 DIAGNOSIS — Z0000 Encounter for general adult medical examination without abnormal findings: Secondary | ICD-10-CM | POA: Diagnosis not present

## 2018-05-08 DIAGNOSIS — M160 Bilateral primary osteoarthritis of hip: Secondary | ICD-10-CM | POA: Diagnosis not present

## 2018-05-08 DIAGNOSIS — Z13220 Encounter for screening for lipoid disorders: Secondary | ICD-10-CM | POA: Diagnosis not present

## 2018-05-08 DIAGNOSIS — M8580 Other specified disorders of bone density and structure, unspecified site: Secondary | ICD-10-CM | POA: Diagnosis not present

## 2018-05-08 DIAGNOSIS — R05 Cough: Secondary | ICD-10-CM | POA: Diagnosis not present

## 2018-05-08 DIAGNOSIS — Z720 Tobacco use: Secondary | ICD-10-CM | POA: Diagnosis not present

## 2018-05-08 DIAGNOSIS — I10 Essential (primary) hypertension: Secondary | ICD-10-CM | POA: Diagnosis not present

## 2018-05-08 DIAGNOSIS — R69 Illness, unspecified: Secondary | ICD-10-CM | POA: Diagnosis not present

## 2018-05-10 DIAGNOSIS — M5136 Other intervertebral disc degeneration, lumbar region: Secondary | ICD-10-CM | POA: Diagnosis not present

## 2018-05-10 DIAGNOSIS — M62830 Muscle spasm of back: Secondary | ICD-10-CM | POA: Diagnosis not present

## 2018-05-10 DIAGNOSIS — M9903 Segmental and somatic dysfunction of lumbar region: Secondary | ICD-10-CM | POA: Diagnosis not present

## 2018-05-10 DIAGNOSIS — M545 Low back pain: Secondary | ICD-10-CM | POA: Diagnosis not present

## 2018-05-16 DIAGNOSIS — R69 Illness, unspecified: Secondary | ICD-10-CM | POA: Diagnosis not present

## 2018-05-28 IMAGING — MG 2D DIGITAL SCREENING BILATERAL MAMMOGRAM WITH CAD AND ADJUNCT TO
9 series · 9 of 25 positions shown · non-contrast
Comparison: Previous exam(s).

CLINICAL DATA: Screening.

EXAM:
2D DIGITAL SCREENING BILATERAL MAMMOGRAM WITH CAD AND ADJUNCT TOMO

[R XCCL]
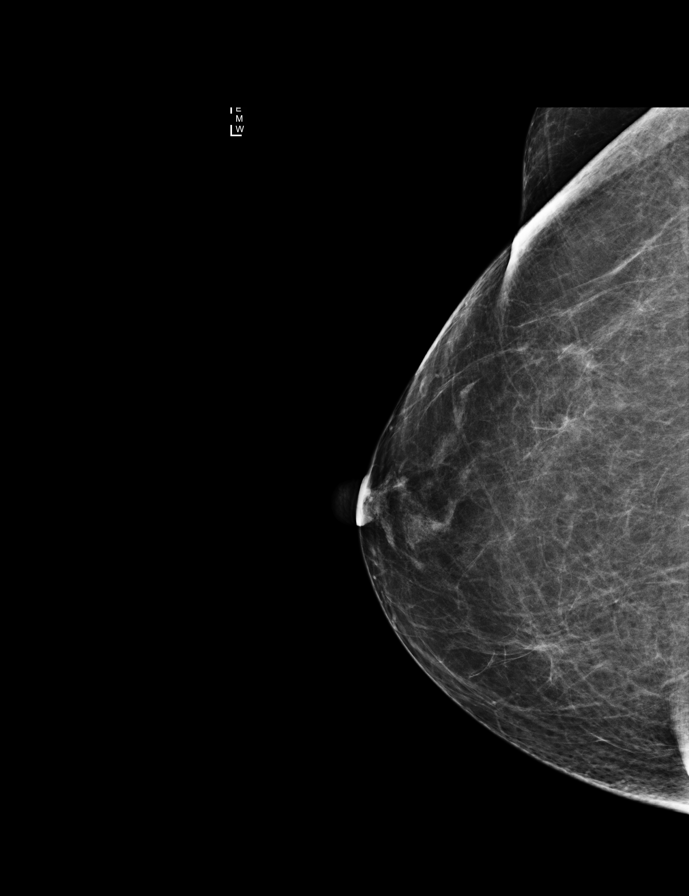

[L CC]
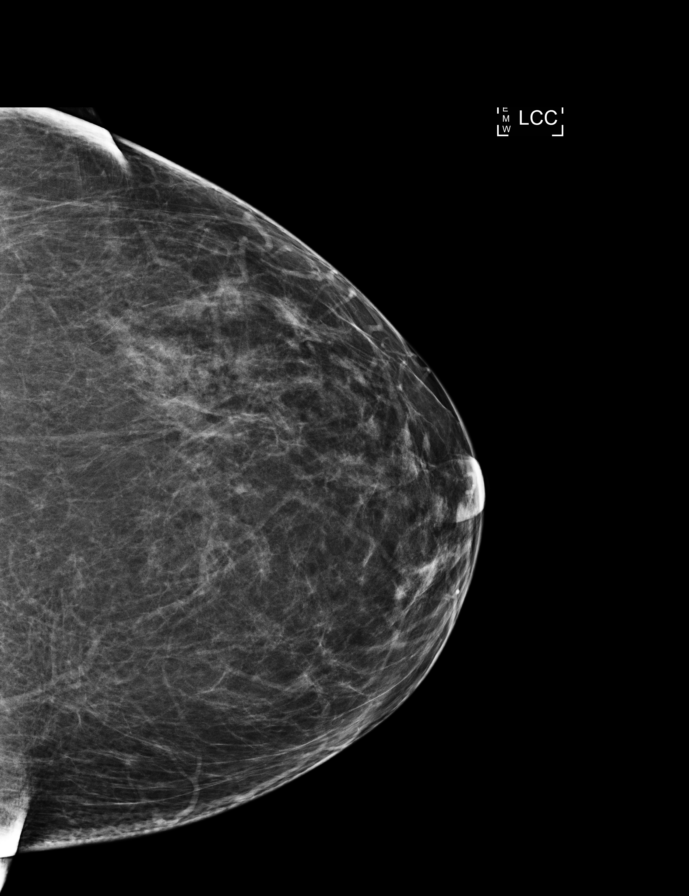

[R CC]
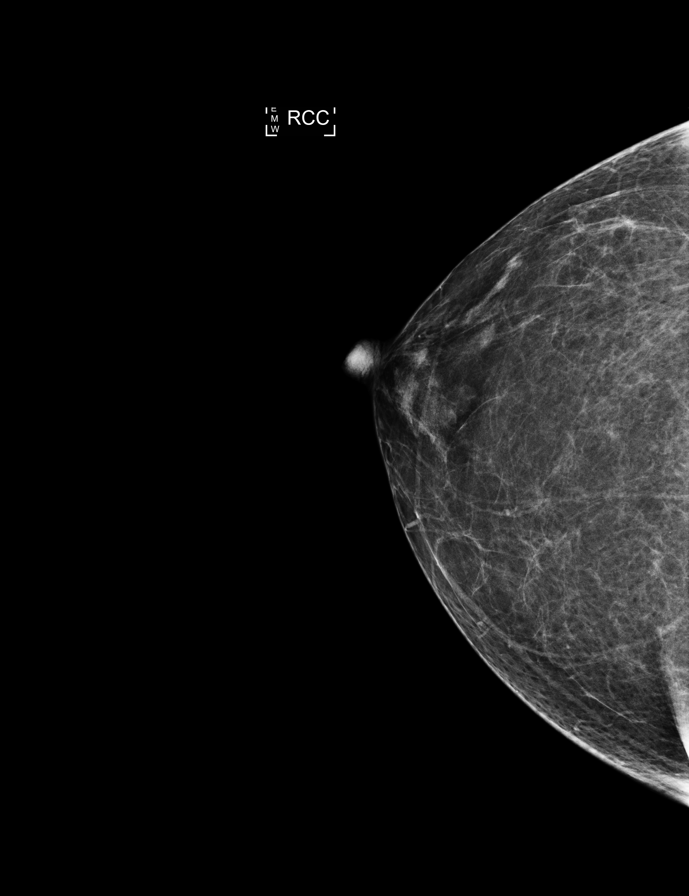

[R MLO]
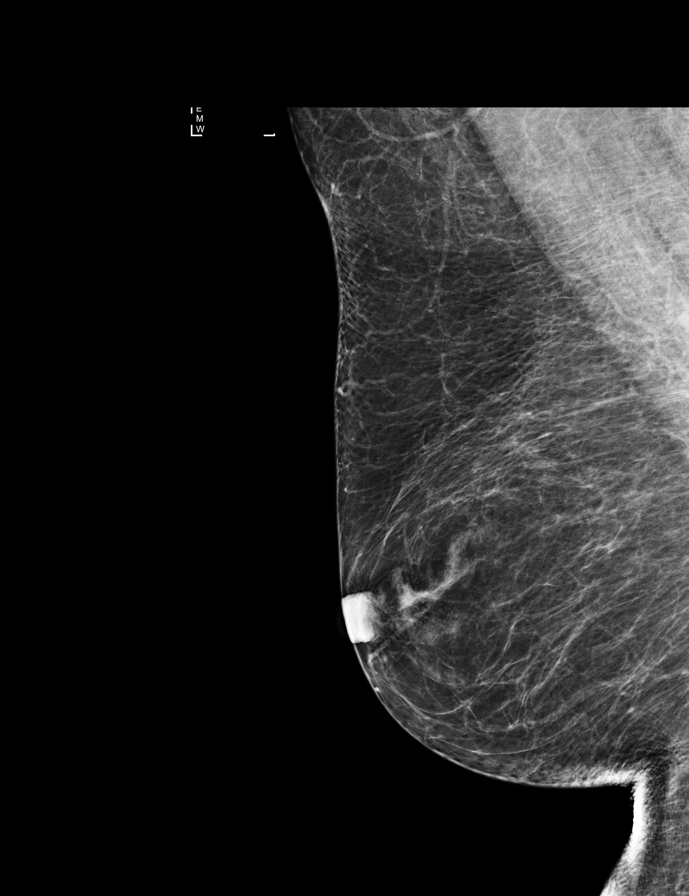

[L MLO]
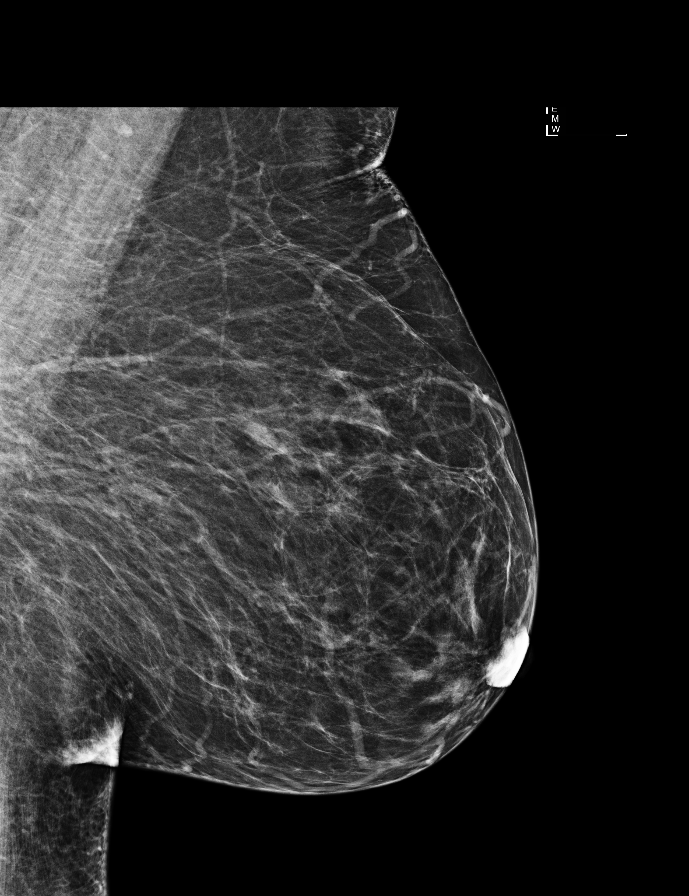

[L CC tomo · tomo slice 34/67.0]
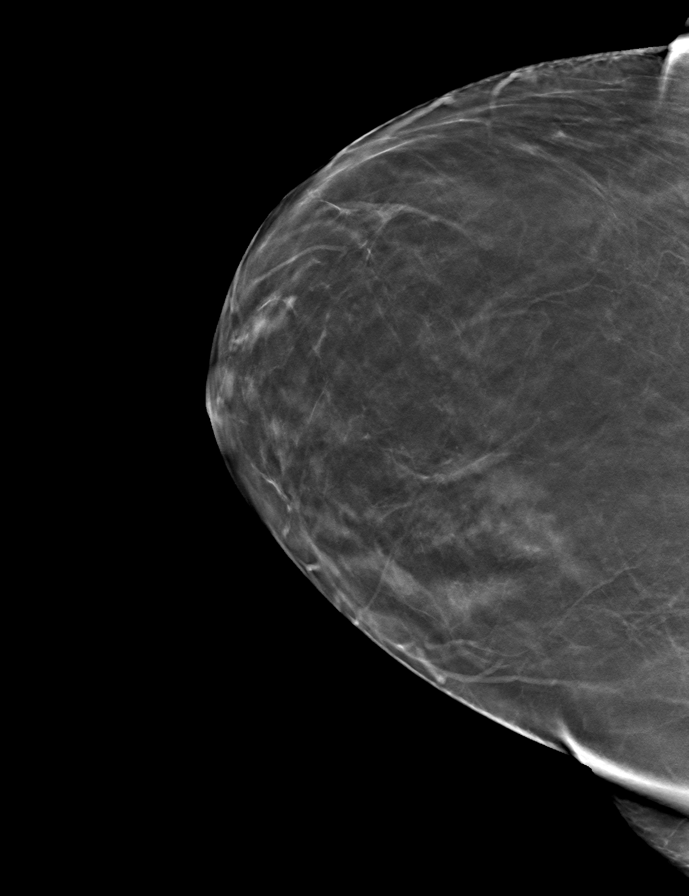

[R CC tomo · tomo slice 29/57.0]
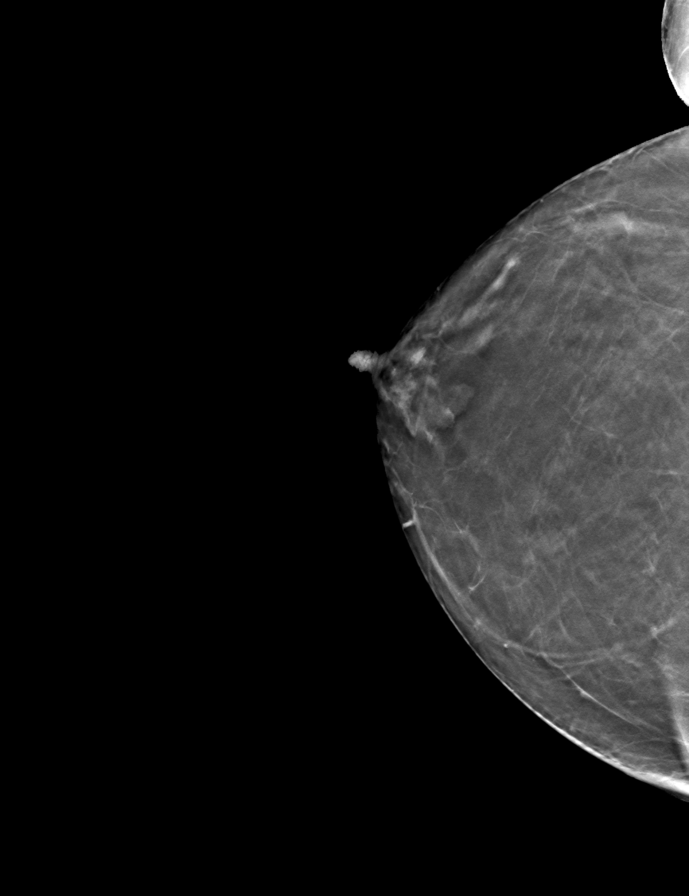

[L MLO tomo · tomo slice 35/69.0]
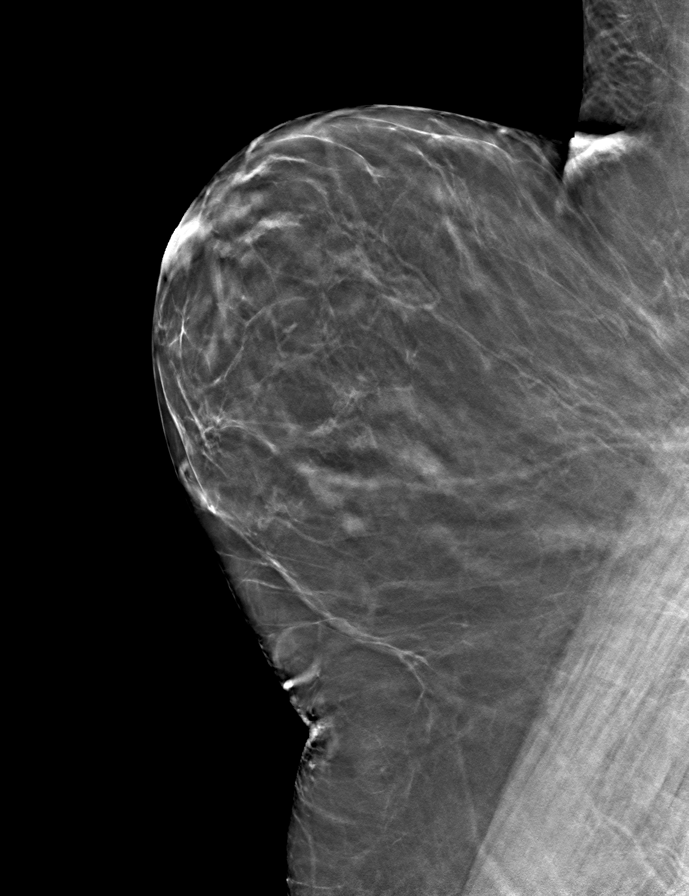

[R MLO tomo · tomo slice 35/69.0]
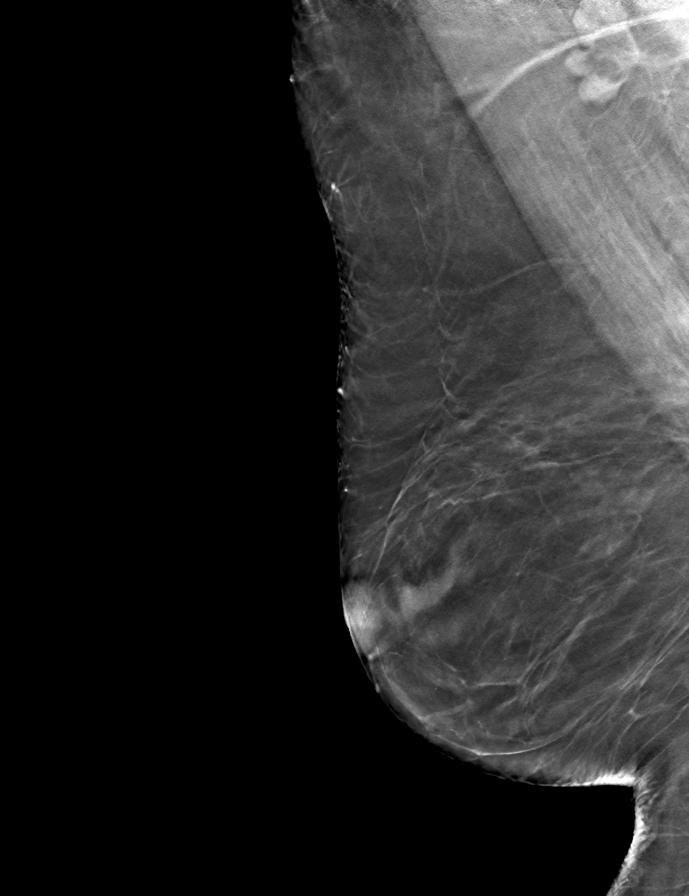

[9 of 25 positions shown; findings below may reference images not displayed]

ACR Breast Density Category c: The breast tissue is heterogeneously
dense, which may obscure small masses.
FINDINGS: There are no findings suspicious for malignancy. Images were
processed with CAD.
IMPRESSION: No mammographic evidence of malignancy. A result letter of this
screening mammogram will be mailed directly to the patient.

RECOMMENDATION:
Screening mammogram in one year. (Code:[id number])

BI-RADS CATEGORY  1: Negative.

## 2018-06-14 DIAGNOSIS — M5136 Other intervertebral disc degeneration, lumbar region: Secondary | ICD-10-CM | POA: Diagnosis not present

## 2018-06-14 DIAGNOSIS — M9903 Segmental and somatic dysfunction of lumbar region: Secondary | ICD-10-CM | POA: Diagnosis not present

## 2018-06-14 DIAGNOSIS — M545 Low back pain: Secondary | ICD-10-CM | POA: Diagnosis not present

## 2018-06-14 DIAGNOSIS — M62830 Muscle spasm of back: Secondary | ICD-10-CM | POA: Diagnosis not present

## 2018-07-11 DIAGNOSIS — M25775 Osteophyte, left foot: Secondary | ICD-10-CM | POA: Diagnosis not present

## 2018-07-11 DIAGNOSIS — M659 Synovitis and tenosynovitis, unspecified: Secondary | ICD-10-CM | POA: Diagnosis not present

## 2018-07-11 DIAGNOSIS — M67472 Ganglion, left ankle and foot: Secondary | ICD-10-CM | POA: Diagnosis not present

## 2018-07-17 DIAGNOSIS — M5136 Other intervertebral disc degeneration, lumbar region: Secondary | ICD-10-CM | POA: Diagnosis not present

## 2018-07-17 DIAGNOSIS — M62830 Muscle spasm of back: Secondary | ICD-10-CM | POA: Diagnosis not present

## 2018-07-17 DIAGNOSIS — M545 Low back pain: Secondary | ICD-10-CM | POA: Diagnosis not present

## 2018-07-17 DIAGNOSIS — M9903 Segmental and somatic dysfunction of lumbar region: Secondary | ICD-10-CM | POA: Diagnosis not present

## 2018-07-20 DIAGNOSIS — M7752 Other enthesopathy of left foot: Secondary | ICD-10-CM | POA: Diagnosis not present

## 2018-07-20 DIAGNOSIS — M67472 Ganglion, left ankle and foot: Secondary | ICD-10-CM | POA: Diagnosis not present

## 2018-08-08 ENCOUNTER — Ambulatory Visit: Payer: Medicare HMO | Admitting: Podiatry

## 2018-08-14 DIAGNOSIS — M25561 Pain in right knee: Secondary | ICD-10-CM | POA: Diagnosis not present

## 2018-08-14 DIAGNOSIS — M25562 Pain in left knee: Secondary | ICD-10-CM | POA: Diagnosis not present

## 2018-08-14 DIAGNOSIS — R69 Illness, unspecified: Secondary | ICD-10-CM | POA: Diagnosis not present

## 2018-08-15 ENCOUNTER — Ambulatory Visit: Payer: Medicare HMO | Admitting: Podiatry

## 2018-08-15 ENCOUNTER — Ambulatory Visit (INDEPENDENT_AMBULATORY_CARE_PROVIDER_SITE_OTHER): Payer: Medicare HMO

## 2018-08-15 ENCOUNTER — Encounter: Payer: Self-pay | Admitting: Podiatry

## 2018-08-15 DIAGNOSIS — M67479 Ganglion, unspecified ankle and foot: Secondary | ICD-10-CM

## 2018-08-15 DIAGNOSIS — M779 Enthesopathy, unspecified: Secondary | ICD-10-CM

## 2018-08-15 DIAGNOSIS — M778 Other enthesopathies, not elsewhere classified: Secondary | ICD-10-CM

## 2018-08-19 NOTE — Progress Notes (Signed)
   HPI: 72 year old female presenting today as a new patient with a chief complaint of a ganglion cyst noted to the left dorsal midfoot that appeared a few months ago. She reports associated cramping pain. She states the cyst was smaller when it first appeared but now has gotten larger. She received an injection for treatment about three weeks ago. She has been using arch supports to help alleviate the symptoms. Walking and being on the foot increases the pain. She states she was told she had arthritis and bone spurs in the past. Patient is here for further evaluation and treatment.   History reviewed. No pertinent past medical history.   Physical Exam: General: The patient is alert and oriented x3 in no acute distress.  Dermatology: Skin is warm, dry and supple bilateral lower extremities. Negative for open lesions or macerations.  Vascular: Palpable pedal pulses bilaterally. No edema or erythema noted. Capillary refill within normal limits.  Neurological: Epicritic and protective threshold grossly intact bilaterally.   Musculoskeletal Exam: Fluctuant, non-adhered mass noted to the dorsal left foot. Pain with palpation to the left midfoot. Range of motion within normal limits to all pedal and ankle joints bilateral. Muscle strength 5/5 in all groups bilateral.   Radiographic Exam:  Normal osseous mineralization. Joint spaces preserved. No fracture/dislocation/boney destruction.    Assessment: 1. Midfoot DJD/capsulitis left 2. Ganglion cyst left dorsal midfoot   Plan of Care:  1. Patient evaluated. X-Rays reviewed.  2. Injection of 0.5 mLs Celestone Soluspan injected into the left midfoot.  3. Patient has Meloxicam at home. Recommended taking daily for four weeks.  4. Continue using OTC arch supports.  5. Return to clinic in 4 weeks.       Edrick Kins, DPM [facility]  Dr. Edrick Kins, DPM    [address], [place] [address]                Office [phone]  Fax [phone]

## 2018-08-21 DIAGNOSIS — M5136 Other intervertebral disc degeneration, lumbar region: Secondary | ICD-10-CM | POA: Diagnosis not present

## 2018-08-21 DIAGNOSIS — M9903 Segmental and somatic dysfunction of lumbar region: Secondary | ICD-10-CM | POA: Diagnosis not present

## 2018-08-21 DIAGNOSIS — M545 Low back pain: Secondary | ICD-10-CM | POA: Diagnosis not present

## 2018-08-21 DIAGNOSIS — M62830 Muscle spasm of back: Secondary | ICD-10-CM | POA: Diagnosis not present

## 2018-09-03 DIAGNOSIS — S79919A Unspecified injury of unspecified hip, initial encounter: Secondary | ICD-10-CM | POA: Diagnosis not present

## 2018-09-11 DIAGNOSIS — R69 Illness, unspecified: Secondary | ICD-10-CM | POA: Diagnosis not present

## 2018-09-17 ENCOUNTER — Encounter: Payer: Self-pay | Admitting: Podiatry

## 2018-09-17 ENCOUNTER — Ambulatory Visit: Payer: Medicare HMO | Admitting: Podiatry

## 2018-09-17 DIAGNOSIS — M778 Other enthesopathies, not elsewhere classified: Secondary | ICD-10-CM

## 2018-09-17 DIAGNOSIS — M779 Enthesopathy, unspecified: Secondary | ICD-10-CM | POA: Diagnosis not present

## 2018-09-17 DIAGNOSIS — M67479 Ganglion, unspecified ankle and foot: Secondary | ICD-10-CM

## 2018-09-18 NOTE — Progress Notes (Signed)
   HPI: 72 year old female presenting today for follow up evaluation of left foot pain. She reports some continued swelling on the dorsal aspect of the foot but denies pain. She reports some intermittent cramping but nothing severe. She has been taking Meloxicam for treatment. There are no modifying factors noted. Patient is here for further evaluation and treatment.   History reviewed. No pertinent past medical history.   Physical Exam: General: The patient is alert and oriented x3 in no acute distress.  Dermatology: Skin is warm, dry and supple bilateral lower extremities. Negative for open lesions or macerations.  Vascular: Palpable pedal pulses bilaterally. No edema or erythema noted. Capillary refill within normal limits.  Neurological: Epicritic and protective threshold grossly intact bilaterally.   Musculoskeletal Exam: Fluctuant, non-adhered mass noted to the dorsal left foot. Range of motion within normal limits to all pedal and ankle joints bilateral. Muscle strength 5/5 in all groups bilateral.   Assessment: 1. Midfoot DJD/capsulitis left - resolved  2. Ganglion cyst left dorsal midfoot   Plan of Care:  1. Patient evaluated.  2. Compression anklet dispensed.  3. Recommended new OTC arch supports.  4. Continue taking Meloxicam as needed.  5. Return to clinic as needed.       Edrick Kins, DPM [facility]  Dr. Edrick Kins, DPM    [address], [place] [address]                Office [phone]  Fax [phone]

## 2018-09-25 DIAGNOSIS — M5136 Other intervertebral disc degeneration, lumbar region: Secondary | ICD-10-CM | POA: Diagnosis not present

## 2018-09-25 DIAGNOSIS — M545 Low back pain: Secondary | ICD-10-CM | POA: Diagnosis not present

## 2018-09-25 DIAGNOSIS — M9903 Segmental and somatic dysfunction of lumbar region: Secondary | ICD-10-CM | POA: Diagnosis not present

## 2018-09-25 DIAGNOSIS — M62830 Muscle spasm of back: Secondary | ICD-10-CM | POA: Diagnosis not present

## 2019-04-01 DIAGNOSIS — M25551 Pain in right hip: Secondary | ICD-10-CM | POA: Diagnosis not present

## 2019-04-01 DIAGNOSIS — M25552 Pain in left hip: Secondary | ICD-10-CM | POA: Diagnosis not present

## 2019-04-04 DIAGNOSIS — L821 Other seborrheic keratosis: Secondary | ICD-10-CM | POA: Diagnosis not present

## 2019-04-04 DIAGNOSIS — D225 Melanocytic nevi of trunk: Secondary | ICD-10-CM | POA: Diagnosis not present

## 2019-04-04 DIAGNOSIS — D1801 Hemangioma of skin and subcutaneous tissue: Secondary | ICD-10-CM | POA: Diagnosis not present

## 2019-05-15 DIAGNOSIS — M25552 Pain in left hip: Secondary | ICD-10-CM | POA: Diagnosis not present

## 2019-05-15 DIAGNOSIS — M1612 Unilateral primary osteoarthritis, left hip: Secondary | ICD-10-CM | POA: Diagnosis not present

## 2019-05-21 DIAGNOSIS — M25512 Pain in left shoulder: Secondary | ICD-10-CM | POA: Diagnosis not present

## 2019-05-21 DIAGNOSIS — M19012 Primary osteoarthritis, left shoulder: Secondary | ICD-10-CM | POA: Diagnosis not present

## 2019-06-11 DIAGNOSIS — Z01818 Encounter for other preprocedural examination: Secondary | ICD-10-CM | POA: Diagnosis not present

## 2019-06-11 DIAGNOSIS — Z720 Tobacco use: Secondary | ICD-10-CM | POA: Diagnosis not present

## 2019-06-11 DIAGNOSIS — M25852 Other specified joint disorders, left hip: Secondary | ICD-10-CM | POA: Diagnosis not present

## 2019-07-11 DIAGNOSIS — R69 Illness, unspecified: Secondary | ICD-10-CM | POA: Diagnosis not present

## 2019-07-12 NOTE — H&P (Signed)
TOTAL HIP ADMISSION H&P  Patient is admitted for left total hip arthroplasty, anterior approach  Subjective:  Chief Complaint:   Left hip primary OA / pain  HPI: Jacqueline Ryan, 73 y.o. female, has a history of pain and functional disability in the left hip(s) due to arthritis and patient has failed non-surgical conservative treatments for greater than 12 weeks to include NSAID's and/or analgesics, corticosteriod injections and activity modification.  Onset of symptoms was gradual starting  years ago with gradually worsening course since that time.The patient noted no past surgery on the left hip(s).  Patient currently rates pain in the left hip at 8 out of 10 with activity. Patient has worsening of pain with activity and weight bearing, trendelenberg gait, pain that interfers with activities of daily living and pain with passive range of motion. Patient has evidence of periarticular osteophytes and joint space narrowing by imaging studies. This condition presents safety issues increasing the risk of falls.   There is no current active infection.  Risks, benefits and expectations were discussed with the patient.  Risks including but not limited to the risk of anesthesia, blood clots, nerve damage, blood vessel damage, failure of the prosthesis, infection and up to and including death.  Patient understand the risks, benefits and expectations and wishes to proceed with surgery.   PCP: Camille Bal, PA-C  D/C Plans:       Home   Post-op Meds:       No Rx given   Tranexamic Acid:      To be given - IV   Decadron:      Is to be given  FYI:      ASA  Norco  DME:   Rx for equipment sent  PT:   HEP     Past Medical History:  Diagnosis Date  . Anxiety   . Arthritis   . Hepatitis    age 21  . Hypertension     Past Surgical History:  Procedure Laterality Date  . ABDOMINAL HYSTERECTOMY    . EYE SURGERY    . TONSILLECTOMY      No current facility-administered medications for this  encounter.    Current Outpatient Medications  Medication Sig Dispense Refill Last Dose  . amLODipine (NORVASC) 2.5 MG tablet Take 2.5 mg by mouth at bedtime.   3   . losartan-hydrochlorothiazide (HYZAAR) 100-12.5 MG tablet Take 1 tablet by mouth daily.     . Multiple Vitamins-Minerals (PRESERVISION/LUTEIN PO) Take 1 capsule by mouth daily.      No Known Allergies   Social History   Tobacco Use  . Smoking status: Current Every Day Smoker    Packs/day: 1.00    Years: 15.00    Pack years: 15.00    Types: Cigarettes  . Smokeless tobacco: Never Used  Substance Use Topics  . Alcohol use: Not Currently    Alcohol/week: 9.0 standard drinks    Types: 9 Cans of beer per week       Review of Systems  Constitutional: Negative.   HENT: Negative.   Eyes: Negative.   Respiratory: Negative.   Cardiovascular: Negative.   Gastrointestinal: Negative.   Genitourinary: Negative.   Musculoskeletal: Positive for joint pain.  Skin: Negative.   Neurological: Negative.   Endo/Heme/Allergies: Positive for environmental allergies.  Psychiatric/Behavioral: The patient is nervous/anxious.     Objective:  Physical Exam  Constitutional: She is oriented to person, place, and time. She appears well-developed.  HENT:  Head: Normocephalic.  Eyes:  Pupils are equal, round, and reactive to light.  Neck: Neck supple. No JVD present. No tracheal deviation present. No thyromegaly present.  Cardiovascular: Normal rate, regular rhythm and intact distal pulses.  Respiratory: Effort normal and breath sounds normal. No respiratory distress. She has no wheezes.  GI: Soft. There is no abdominal tenderness. There is no guarding.  Lymphadenopathy:    She has no cervical adenopathy.  Neurological: She is alert and oriented to person, place, and time.  Skin: Skin is warm and dry.  Psychiatric: She has a normal mood and affect.      Imaging Review Plain radiographs demonstrate severe degenerative joint  disease of the left hip. The bone quality appears to be good for age and reported activity level.      Assessment/Plan:  End stage arthritis, left hip  The patient history, physical examination, clinical judgement of the provider and imaging studies are consistent with end stage degenerative joint disease of the left hip and total hip arthroplasty is deemed medically necessary. The treatment options including medical management, injection therapy, arthroscopy and arthroplasty were discussed at length. The risks and benefits of total hip arthroplasty were presented and reviewed. The risks due to aseptic loosening, infection, stiffness, dislocation/subluxation,  thromboembolic complications and other imponderables were discussed.  The patient acknowledged the explanation, agreed to proceed with the plan and consent was signed. Patient is being admitted for inpatient treatment for surgery, pain control, PT, OT, prophylactic antibiotics, VTE prophylaxis, progressive ambulation and ADL's and discharge planning.The patient is planning to be discharged home.     [place] Reyansh Kushnir   PA-C  07/22/2019, 8:18 PM

## 2019-07-16 NOTE — Patient Instructions (Addendum)
DUE TO COVID-19 ONLY ONE VISITOR IS ALLOWED TO COME WITH YOU AND STAY IN THE WAITING ROOM ONLY DURING PRE OP AND PROCEDURE DAY OF SURGERY. THE 1 VISITOR MAY VISIT WITH YOU AFTER SURGERY IN YOUR PRIVATE ROOM DURING VISITING HOURS ONLY!  YOU NEED TO HAVE A COVID 19 TEST ON_10/9______ @__2 :55_____, THIS TEST MUST BE DONE BEFORE SURGERY, COME  Jacqueline Ryan , [id number].  ([place]) ONCE YOUR COVID TEST IS COMPLETED, PLEASE BEGIN THE QUARANTINE INSTRUCTIONS AS OUTLINED IN YOUR HANDOUT.                [name]    Your procedure is scheduled on: 07/23/19   Report to [facility] Main  Entrance   Report to admitting at  6:10 AM     Call this number if you have problems the morning of surgery [place], [place].   Do not eat food After Midnight.   YOU MAY HAVE CLEAR LIQUIDS FROM MIDNIGHT UNTIL 5:30 AM.   At 5:30 AM Please finish the prescribed Pre-Surgery  drink.   Nothing by mouth after you finish the  drink !   Take these medicines the morning of surgery with A SIP OF WATER: None                                 You may not have any metal on your body including hair pins and              piercings             Do not wear jewelry, make-up, lotions, powders or perfumes, deodorant             Do not wear nail polish on your fingernails.  Do not shave  48 hours prior to surgery.             Do not bring valuables to the hospital. [place].  Contacts, dentures or bridgework may not be worn into surgery.       Name and phone number of your driver:  Special Instructions: N/A              Please read over the following fact sheets you were given: _____________________________________________________________________             [facility] - Preparing for Surgery  Before surgery, you can play an  important role .  Because skin is not sterile, your skin needs to be as free of germs as possible.   You can reduce the number of germs on your skin by washing with CHG (chlorahexidine gluconate) soap before surgery.   CHG is an antiseptic cleaner which kills germs and bonds with the skin to continue killing germs even after washing. Please DO NOT use if you have an allergy to CHG or antibacterial soaps.   If your skin becomes reddened/irritated stop using the CHG and inform your nurse when you arrive at Short Stay. Do not shave (including legs and underarms) for at least 48 hours prior to the first CHG shower.  Please follow these instructions carefully:  1.  Shower with CHG Soap the night before surgery and the  morning of Surgery.  2.  If you choose to wash your hair, wash your hair first as usual with your  normal  shampoo.  3.  After you shampoo, rinse your hair and body thoroughly to remove the  shampoo.                                        4.  Use CHG as you would any other liquid soap.  You can apply chg directly  to the skin and wash                       Gently with a scrungie or clean washcloth.  5.  Apply the CHG Soap to your body ONLY FROM THE NECK DOWN.   Do not use on face/ open                           Wound or open sores. Avoid contact with eyes, ears mouth and genitals (private parts).                       Wash face,  Genitals (private parts) with your normal soap.             6.  Wash thoroughly, paying special attention to the area where your surgery  will be performed.  7.  Thoroughly rinse your body with warm water from the neck down.  8.  DO NOT shower/wash with your normal soap after using and rinsing off  the CHG Soap.                9.  Pat yourself dry with a clean towel.            10.  Wear clean pajamas.            11.  Place clean sheets on your bed the night of your first shower and do not  sleep with pets. Day of Surgery : Do not apply any  lotions/deodorants the morning of surgery.  Please wear clean clothes to the hospital/surgery center.  FAILURE TO FOLLOW THESE INSTRUCTIONS MAY RESULT IN THE CANCELLATION OF YOUR SURGERY PATIENT SIGNATURE_________________________________  NURSE SIGNATURE__________________________________  ________________________________________________________________________   Adam Phenix  An incentive spirometer is a tool that can help keep your lungs clear and active. This tool measures how well you are filling your lungs with each breath. Taking long deep breaths may help reverse or decrease the chance of developing breathing (pulmonary) problems (especially infection) following:  A long period of time when you are unable to move or be active. BEFORE THE PROCEDURE   If the spirometer includes an indicator to show your best effort, your nurse or respiratory therapist will set it to a desired goal.  If possible, sit up straight or lean slightly forward. Try not to slouch.  Hold the incentive spirometer in an upright position. INSTRUCTIONS FOR USE  1. Sit on the edge of your bed if possible, or sit up as far as you can in bed or on a chair. 2. Hold the incentive spirometer in an upright position. 3. Breathe out normally. 4. Place the mouthpiece in your mouth and seal your lips tightly around it. 5. Breathe in slowly and as deeply as possible, raising the piston or the ball toward the top of the column. 6. Hold your breath  for 3-5 seconds or for as long as possible. Allow the piston or ball to fall to the bottom of the column. 7. Remove the mouthpiece from your mouth and breathe out normally. 8. Rest for a few seconds and repeat Steps 1 through 7 at least 10 times every 1-2 hours when you are awake. Take your time and take a few normal breaths between deep breaths. 9. The spirometer may include an indicator to show your best effort. Use the indicator as a goal to work toward during each  repetition. 10. After each set of 10 deep breaths, practice coughing to be sure your lungs are clear. If you have an incision (the cut made at the time of surgery), support your incision when coughing by placing a pillow or rolled up towels firmly against it. Once you are able to get out of bed, walk around indoors and cough well. You may stop using the incentive spirometer when instructed by your caregiver.  RISKS AND COMPLICATIONS  Take your time so you do not get dizzy or light-headed.  If you are in pain, you may need to take or ask for pain medication before doing incentive spirometry. It is harder to take a deep breath if you are having pain. AFTER USE  Rest and breathe slowly and easily.  It can be helpful to keep track of a log of your progress. Your caregiver can provide you with a simple table to help with this. If you are using the spirometer at home, follow these instructions: Jacqueline Ryan IF:   You are having difficultly using the spirometer.  You have trouble using the spirometer as often as instructed.  Your pain medication is not giving enough relief while using the spirometer.  You develop fever of 100.5 F (38.1 C) or higher. SEEK IMMEDIATE MEDICAL CARE IF:   You cough up bloody sputum that had not been present before.  You develop fever of 102 F (38.9 C) or greater.  You develop worsening pain at or near the incision site. MAKE SURE YOU:   Understand these instructions.  Will watch your condition.  Will get help right away if you are not doing well or get worse. Document Released: 02/06/2007 Document Revised: 12/19/2011 Document Reviewed: 04/09/2007 Exit[employer] Patient Information 2014 [employer], [place].   ________________________________________________________________________  WHAT IS A BLOOD TRANSFUSION? Blood Transfusion Information  A transfusion is the replacement of blood or some of its parts. Blood is made up of multiple cells which provide  different functions.  Red blood cells carry oxygen and are used for blood loss replacement.  White blood cells fight against infection.  Platelets control bleeding.  Plasma helps clot blood.  Other blood products are available for specialized needs, such as hemophilia or other clotting disorders. BEFORE THE TRANSFUSION  Who gives blood for transfusions?   Healthy volunteers who are fully evaluated to make sure their blood is safe. This is blood bank blood. Transfusion therapy is the safest it has ever been in the practice of medicine. Before blood is taken from a donor, a complete history is taken to make sure that person has no history of diseases nor engages in risky social behavior (examples are intravenous drug use or sexual activity with multiple partners). The donor's travel history is screened to minimize risk of transmitting infections, such as malaria. The donated blood is tested for signs of infectious diseases, such as HIV and hepatitis. The blood is then tested to be sure it is compatible with you  in order to minimize the chance of a transfusion reaction. If you or a relative donates blood, this is often done in anticipation of surgery and is not appropriate for emergency situations. It takes many days to process the donated blood. RISKS AND COMPLICATIONS Although transfusion therapy is very safe and saves many lives, the main dangers of transfusion include:   Getting an infectious disease.  Developing a transfusion reaction. This is an allergic reaction to something in the blood you were given. Every precaution is taken to prevent this. The decision to have a blood transfusion has been considered carefully by your caregiver before blood is given. Blood is not given unless the benefits outweigh the risks. AFTER THE TRANSFUSION  Right after receiving a blood transfusion, you will usually feel much better and more energetic. This is especially true if your red blood cells have  gotten low (anemic). The transfusion raises the level of the red blood cells which carry oxygen, and this usually causes an energy increase.  The nurse administering the transfusion will monitor you carefully for complications. HOME CARE INSTRUCTIONS  No special instructions are needed after a transfusion. You may find your energy is better. Speak with your caregiver about any limitations on activity for underlying diseases you may have. SEEK MEDICAL CARE IF:   Your condition is not improving after your transfusion.  You develop redness or irritation at the intravenous (IV) site. SEEK IMMEDIATE MEDICAL CARE IF:  Any of the following symptoms occur over the next 12 hours:  Shaking chills.  You have a temperature by mouth above 102 F (38.9 C), not controlled by medicine.  Chest, back, or muscle pain.  People around you feel you are not acting correctly or are confused.  Shortness of breath or difficulty breathing.  Dizziness and fainting.  You get a rash or develop hives.  You have a decrease in urine output.  Your urine turns a dark color or changes to pink, red, or brown. Any of the following symptoms occur over the next 10 days:  You have a temperature by mouth above 102 F (38.9 C), not controlled by medicine.  Shortness of breath.  Weakness after normal activity.  The white part of the eye turns yellow (jaundice).  You have a decrease in the amount of urine or are urinating less often.  Your urine turns a dark color or changes to pink, red, or brown. Document Released: 09/23/2000 Document Revised: 12/19/2011 Document Reviewed: 05/12/2008 [facility] Patient Information 2014 [place], [place].  _______________________________________________________________________

## 2019-07-17 ENCOUNTER — Encounter (INDEPENDENT_AMBULATORY_CARE_PROVIDER_SITE_OTHER): Payer: Self-pay

## 2019-07-17 ENCOUNTER — Encounter (HOSPITAL_COMMUNITY)
Admission: RE | Admit: 2019-07-17 | Discharge: 2019-07-17 | Disposition: A | Discharge status: Home / Self Care | Payer: Medicare HMO | Source: Ambulatory Visit | Attending: Orthopedic Surgery | Admitting: Orthopedic Surgery

## 2019-07-17 ENCOUNTER — Other Ambulatory Visit: Payer: Self-pay

## 2019-07-17 ENCOUNTER — Encounter (HOSPITAL_COMMUNITY): Payer: Self-pay

## 2019-07-17 DIAGNOSIS — M1612 Unilateral primary osteoarthritis, left hip: Secondary | ICD-10-CM | POA: Insufficient documentation

## 2019-07-17 DIAGNOSIS — Z01812 Encounter for preprocedural laboratory examination: Secondary | ICD-10-CM | POA: Insufficient documentation

## 2019-07-17 DIAGNOSIS — I10 Essential (primary) hypertension: Secondary | ICD-10-CM | POA: Diagnosis not present

## 2019-07-17 HISTORY — DX: Essential (primary) hypertension: I10

## 2019-07-17 HISTORY — DX: Unspecified osteoarthritis, unspecified site: M19.90

## 2019-07-17 HISTORY — DX: Inflammatory liver disease, unspecified: K75.9

## 2019-07-17 HISTORY — DX: Anxiety disorder, unspecified: F41.9

## 2019-07-17 LAB — CBC
HCT: 49.1 % — ABNORMAL HIGH (ref 36.0–46.0)
Hemoglobin: 16.8 g/dL — ABNORMAL HIGH (ref 12.0–15.0)
MCH: 32.9 pg (ref 26.0–34.0)
MCHC: 34.2 g/dL (ref 30.0–36.0)
MCV: 96.1 fL (ref 80.0–100.0)
Platelets: 291 10*3/uL (ref 150–400)
RBC: 5.11 MIL/uL (ref 3.87–5.11)
RDW: 12.5 % (ref 11.5–15.5)
WBC: 9.1 10*3/uL (ref 4.0–10.5)
nRBC: 0 % (ref 0.0–0.2)

## 2019-07-17 LAB — BASIC METABOLIC PANEL
Anion gap: 11 (ref 5–15)
BUN: 11 mg/dL (ref 8–23)
CO2: 28 mmol/L (ref 22–32)
Calcium: 9.5 mg/dL (ref 8.9–10.3)
Chloride: 93 mmol/L — ABNORMAL LOW (ref 98–111)
Creatinine, Ser: 0.54 mg/dL (ref 0.44–1.00)
GFR calc Af Amer: >60 mL/min (ref >60)
GFR calc non Af Amer: >60 mL/min (ref >60)
Glucose, Bld: 104 mg/dL — ABNORMAL HIGH (ref 70–99)
Potassium: 4.2 mmol/L (ref 3.5–5.1)
Sodium: 132 mmol/L — ABNORMAL LOW (ref 135–145)

## 2019-07-17 LAB — ABO/RH: ABO/RH(D): O POS

## 2019-07-17 LAB — SURGICAL PCR SCREEN
MRSA, PCR: NEGATIVE
Staphylococcus aureus: NEGATIVE

## 2019-07-19 ENCOUNTER — Other Ambulatory Visit (HOSPITAL_COMMUNITY)
Admission: RE | Admit: 2019-07-19 | Discharge: 2019-07-19 | Disposition: A | Discharge status: Home / Self Care | Payer: Medicare HMO | Source: Ambulatory Visit | Attending: Orthopedic Surgery | Admitting: Orthopedic Surgery

## 2019-07-19 DIAGNOSIS — Z01812 Encounter for preprocedural laboratory examination: Secondary | ICD-10-CM | POA: Diagnosis not present

## 2019-07-19 DIAGNOSIS — Z20828 Contact with and (suspected) exposure to other viral communicable diseases: Secondary | ICD-10-CM | POA: Insufficient documentation

## 2019-07-22 LAB — NOVEL CORONAVIRUS, NAA (HOSP ORDER, SEND-OUT TO REF LAB; TAT 18-24 HRS): SARS-CoV-2, NAA: NOT DETECTED

## 2019-07-22 NOTE — Progress Notes (Signed)
SPOKE WITH PATIENT BY PHONEARRIVE 1150 AM 07-23-19 CLEAR LIQUIDS UNTRIL 1120 AM DRINK ENSURE AT 1120 AM

## 2019-07-23 ENCOUNTER — Inpatient Hospital Stay (HOSPITAL_COMMUNITY): Payer: Medicare HMO

## 2019-07-23 ENCOUNTER — Inpatient Hospital Stay (HOSPITAL_COMMUNITY)
Admission: RE | Admit: 2019-07-23 | Discharge: 2019-07-24 | DRG: 470 | Disposition: A | Discharge status: Home / Self Care | Payer: Medicare HMO | Source: Ambulatory Visit | Attending: Orthopedic Surgery | Admitting: Orthopedic Surgery

## 2019-07-23 ENCOUNTER — Other Ambulatory Visit: Payer: Self-pay

## 2019-07-23 ENCOUNTER — Encounter (HOSPITAL_COMMUNITY): Payer: Self-pay

## 2019-07-23 ENCOUNTER — Encounter (HOSPITAL_COMMUNITY)
Admission: RE | Disposition: A | Discharge status: Home / Self Care | Payer: Self-pay | Source: Ambulatory Visit | Attending: Orthopedic Surgery

## 2019-07-23 ENCOUNTER — Inpatient Hospital Stay (HOSPITAL_COMMUNITY): Payer: Medicare HMO | Admitting: Certified Registered Nurse Anesthetist

## 2019-07-23 ENCOUNTER — Inpatient Hospital Stay (HOSPITAL_COMMUNITY): Payer: Medicare HMO | Admitting: Physician Assistant

## 2019-07-23 DIAGNOSIS — E663 Overweight: Secondary | ICD-10-CM | POA: Diagnosis present

## 2019-07-23 DIAGNOSIS — R69 Illness, unspecified: Secondary | ICD-10-CM | POA: Diagnosis not present

## 2019-07-23 DIAGNOSIS — Z79899 Other long term (current) drug therapy: Secondary | ICD-10-CM | POA: Diagnosis not present

## 2019-07-23 DIAGNOSIS — Z96649 Presence of unspecified artificial hip joint: Secondary | ICD-10-CM

## 2019-07-23 DIAGNOSIS — I10 Essential (primary) hypertension: Secondary | ICD-10-CM | POA: Diagnosis not present

## 2019-07-23 DIAGNOSIS — F17210 Nicotine dependence, cigarettes, uncomplicated: Secondary | ICD-10-CM | POA: Diagnosis present

## 2019-07-23 DIAGNOSIS — Z96642 Presence of left artificial hip joint: Secondary | ICD-10-CM | POA: Diagnosis not present

## 2019-07-23 DIAGNOSIS — E876 Hypokalemia: Secondary | ICD-10-CM | POA: Diagnosis not present

## 2019-07-23 DIAGNOSIS — Z471 Aftercare following joint replacement surgery: Secondary | ICD-10-CM | POA: Diagnosis not present

## 2019-07-23 DIAGNOSIS — Z419 Encounter for procedure for purposes other than remedying health state, unspecified: Secondary | ICD-10-CM

## 2019-07-23 DIAGNOSIS — Z6827 Body mass index (BMI) 27.0-27.9, adult: Secondary | ICD-10-CM

## 2019-07-23 DIAGNOSIS — M1612 Unilateral primary osteoarthritis, left hip: Secondary | ICD-10-CM | POA: Diagnosis not present

## 2019-07-23 HISTORY — PX: TOTAL HIP ARTHROPLASTY: SHX124

## 2019-07-23 LAB — TYPE AND SCREEN
ABO/RH(D): O POS
Antibody Screen: NEGATIVE

## 2019-07-23 SURGERY — ARTHROPLASTY, HIP, TOTAL, ANTERIOR APPROACH
Anesthesia: Monitor Anesthesia Care | Site: Hip | Laterality: Left

## 2019-07-23 MED ORDER — DOCUSATE SODIUM 100 MG PO CAPS
100.0000 mg | ORAL_CAPSULE | Freq: Two times a day (BID) | ORAL | Status: DC
  Administered 2019-07-24: 100 mg via ORAL
  Filled 2019-07-23 (×2): qty 1

## 2019-07-23 MED ORDER — MAGNESIUM CITRATE PO SOLN: 1.0000 | Freq: Once | ORAL | Status: DC | PRN

## 2019-07-23 MED ORDER — METHOCARBAMOL 500 MG IVPB - SIMPLE MED
500.0000 mg | Freq: Four times a day (QID) | INTRAVENOUS | Status: DC | PRN
  Administered 2019-07-23: 500 mg via INTRAVENOUS
  Filled 2019-07-23: qty 50

## 2019-07-23 MED ORDER — PROPOFOL 500 MG/50ML IV EMUL
INTRAVENOUS | Status: AC
  Filled 2019-07-23: qty 100

## 2019-07-23 MED ORDER — METHOCARBAMOL 500 MG PO TABS
500.0000 mg | ORAL_TABLET | Freq: Four times a day (QID) | ORAL | Status: DC | PRN
  Administered 2019-07-23 – 2019-07-24 (×3): 500 mg via ORAL
  Filled 2019-07-23 (×3): qty 1

## 2019-07-23 MED ORDER — PROPOFOL 10 MG/ML IV BOLUS
INTRAVENOUS | Status: AC
  Filled 2019-07-23: qty 20

## 2019-07-23 MED ORDER — PHENYLEPHRINE 40 MCG/ML (10ML) SYRINGE FOR IV PUSH (FOR BLOOD PRESSURE SUPPORT)
PREFILLED_SYRINGE | INTRAVENOUS | Status: AC
  Filled 2019-07-23: qty 10

## 2019-07-23 MED ORDER — POLYETHYLENE GLYCOL 3350 17 G PO PACK
17.0000 g | PACK | Freq: Two times a day (BID) | ORAL | Status: DC
  Filled 2019-07-23 (×2): qty 1

## 2019-07-23 MED ORDER — METOCLOPRAMIDE HCL 5 MG PO TABS: 5.0000 mg | ORAL_TABLET | Freq: Three times a day (TID) | ORAL | Status: DC | PRN

## 2019-07-23 MED ORDER — OXYCODONE HCL 5 MG PO TABS: 5.0000 mg | ORAL_TABLET | Freq: Once | ORAL | Status: DC | PRN

## 2019-07-23 MED ORDER — BISACODYL 10 MG RE SUPP: 10.0000 mg | Freq: Every day | RECTAL | Status: DC | PRN

## 2019-07-23 MED ORDER — PROPOFOL 10 MG/ML IV BOLUS
INTRAVENOUS | Status: DC | PRN
  Administered 2019-07-23 (×2): 20 mg via INTRAVENOUS

## 2019-07-23 MED ORDER — MENTHOL 3 MG MT LOZG: 1.0000 | LOZENGE | OROMUCOSAL | Status: DC | PRN

## 2019-07-23 MED ORDER — CEFAZOLIN SODIUM-DEXTROSE 2-4 GM/100ML-% IV SOLN
2.0000 g | Freq: Four times a day (QID) | INTRAVENOUS | Status: AC
  Administered 2019-07-23 (×2): 2 g via INTRAVENOUS
  Filled 2019-07-23 (×2): qty 100

## 2019-07-23 MED ORDER — HYDROCODONE-ACETAMINOPHEN 7.5-325 MG PO TABS: 1.0000 | ORAL_TABLET | ORAL | Status: DC | PRN

## 2019-07-23 MED ORDER — MORPHINE SULFATE (PF) 4 MG/ML IV SOLN
0.5000 mg | INTRAVENOUS | Status: DC | PRN
  Administered 2019-07-23: 1 mg via INTRAVENOUS
  Filled 2019-07-23: qty 1

## 2019-07-23 MED ORDER — OXYCODONE HCL 5 MG/5ML PO SOLN: 5.0000 mg | Freq: Once | ORAL | Status: DC | PRN

## 2019-07-23 MED ORDER — LOSARTAN POTASSIUM 50 MG PO TABS
100.0000 mg | ORAL_TABLET | Freq: Every day | ORAL | Status: DC
  Administered 2019-07-24: 100 mg via ORAL
  Filled 2019-07-23: qty 2

## 2019-07-23 MED ORDER — HYDROCODONE-ACETAMINOPHEN 5-325 MG PO TABS
1.0000 | ORAL_TABLET | ORAL | Status: DC | PRN
  Administered 2019-07-23 – 2019-07-24 (×6): 2 via ORAL
  Filled 2019-07-23 (×5): qty 2

## 2019-07-23 MED ORDER — HYDROCODONE-ACETAMINOPHEN 5-325 MG PO TABS
ORAL_TABLET | ORAL | Status: AC
  Filled 2019-07-23: qty 2

## 2019-07-23 MED ORDER — AMLODIPINE BESYLATE 5 MG PO TABS: 2.5000 mg | ORAL_TABLET | Freq: Every day | ORAL | Status: DC

## 2019-07-23 MED ORDER — ONDANSETRON HCL 4 MG/2ML IJ SOLN: 4.0000 mg | Freq: Four times a day (QID) | INTRAMUSCULAR | Status: DC | PRN

## 2019-07-23 MED ORDER — LORAZEPAM 0.5 MG PO TABS
0.5000 mg | ORAL_TABLET | Freq: Three times a day (TID) | ORAL | Status: DC
  Administered 2019-07-23: 0.5 mg via ORAL
  Filled 2019-07-23 (×2): qty 1

## 2019-07-23 MED ORDER — CEFAZOLIN SODIUM-DEXTROSE 2-4 GM/100ML-% IV SOLN
2.0000 g | INTRAVENOUS | Status: AC
  Administered 2019-07-23: 2 g via INTRAVENOUS

## 2019-07-23 MED ORDER — MIDAZOLAM HCL 5 MG/5ML IJ SOLN
INTRAMUSCULAR | Status: DC | PRN
  Administered 2019-07-23: 2 mg via INTRAVENOUS

## 2019-07-23 MED ORDER — CEFAZOLIN SODIUM-DEXTROSE 2-4 GM/100ML-% IV SOLN
INTRAVENOUS | Status: AC
  Filled 2019-07-23: qty 100

## 2019-07-23 MED ORDER — PHENYLEPHRINE 40 MCG/ML (10ML) SYRINGE FOR IV PUSH (FOR BLOOD PRESSURE SUPPORT)
PREFILLED_SYRINGE | INTRAVENOUS | Status: DC | PRN
  Administered 2019-07-23: 120 ug via INTRAVENOUS
  Administered 2019-07-23: 80 ug via INTRAVENOUS
  Administered 2019-07-23: 120 ug via INTRAVENOUS

## 2019-07-23 MED ORDER — FENTANYL CITRATE (PF) 100 MCG/2ML IJ SOLN: 25.0000 ug | INTRAMUSCULAR | Status: DC | PRN

## 2019-07-23 MED ORDER — LOSARTAN POTASSIUM-HCTZ 100-12.5 MG PO TABS: 1.0000 | ORAL_TABLET | Freq: Every day | ORAL | Status: DC

## 2019-07-23 MED ORDER — METOCLOPRAMIDE HCL 5 MG/ML IJ SOLN: 5.0000 mg | Freq: Three times a day (TID) | INTRAMUSCULAR | Status: DC | PRN

## 2019-07-23 MED ORDER — METHOCARBAMOL 500 MG IVPB - SIMPLE MED
INTRAVENOUS | Status: AC
  Filled 2019-07-23: qty 50

## 2019-07-23 MED ORDER — SODIUM CHLORIDE 0.9 % IV SOLN
INTRAVENOUS | Status: DC | PRN
  Administered 2019-07-23: 40 ug/min via INTRAVENOUS

## 2019-07-23 MED ORDER — DEXAMETHASONE SODIUM PHOSPHATE 10 MG/ML IJ SOLN
10.0000 mg | Freq: Once | INTRAMUSCULAR | Status: AC
  Administered 2019-07-24: 10 mg via INTRAVENOUS
  Filled 2019-07-23: qty 1

## 2019-07-23 MED ORDER — HYDROCHLOROTHIAZIDE 12.5 MG PO CAPS
12.5000 mg | ORAL_CAPSULE | Freq: Every day | ORAL | Status: DC
  Administered 2019-07-24: 12.5 mg via ORAL
  Filled 2019-07-23: qty 1

## 2019-07-23 MED ORDER — ACETAMINOPHEN 160 MG/5ML PO SOLN: 1000.0000 mg | Freq: Once | ORAL | Status: DC | PRN

## 2019-07-23 MED ORDER — ONDANSETRON HCL 4 MG/2ML IJ SOLN
INTRAMUSCULAR | Status: DC | PRN
  Administered 2019-07-23: 4 mg via INTRAVENOUS

## 2019-07-23 MED ORDER — PROPOFOL 500 MG/50ML IV EMUL
INTRAVENOUS | Status: AC
  Filled 2019-07-23: qty 50

## 2019-07-23 MED ORDER — CHLORHEXIDINE GLUCONATE 4 % EX LIQD: 60.0000 mL | Freq: Once | CUTANEOUS | Status: DC

## 2019-07-23 MED ORDER — MIDAZOLAM HCL 2 MG/2ML IJ SOLN
INTRAMUSCULAR | Status: AC
  Filled 2019-07-23: qty 2

## 2019-07-23 MED ORDER — ACETAMINOPHEN 325 MG PO TABS: 325.0000 mg | ORAL_TABLET | Freq: Four times a day (QID) | ORAL | Status: DC | PRN

## 2019-07-23 MED ORDER — LACTATED RINGERS IV SOLN
INTRAVENOUS | Status: DC
  Administered 2019-07-23 (×2): via INTRAVENOUS

## 2019-07-23 MED ORDER — ACETAMINOPHEN 500 MG PO TABS: 1000.0000 mg | ORAL_TABLET | Freq: Once | ORAL | Status: DC | PRN

## 2019-07-23 MED ORDER — DIPHENHYDRAMINE HCL 12.5 MG/5ML PO ELIX: 12.5000 mg | ORAL_SOLUTION | ORAL | Status: DC | PRN

## 2019-07-23 MED ORDER — PROPOFOL 500 MG/50ML IV EMUL
INTRAVENOUS | Status: DC | PRN
  Administered 2019-07-23: 75 ug/kg/min via INTRAVENOUS

## 2019-07-23 MED ORDER — TRANEXAMIC ACID-NACL 1000-0.7 MG/100ML-% IV SOLN
1000.0000 mg | INTRAVENOUS | Status: AC
  Administered 2019-07-23: 1000 mg via INTRAVENOUS

## 2019-07-23 MED ORDER — SODIUM CHLORIDE 0.9 % IV SOLN
INTRAVENOUS | Status: DC
  Administered 2019-07-23 – 2019-07-24 (×2): via INTRAVENOUS

## 2019-07-23 MED ORDER — FERROUS SULFATE 325 (65 FE) MG PO TABS
325.0000 mg | ORAL_TABLET | Freq: Three times a day (TID) | ORAL | Status: DC
  Administered 2019-07-24: 325 mg via ORAL
  Filled 2019-07-23: qty 1

## 2019-07-23 MED ORDER — PHENOL 1.4 % MT LIQD
1.0000 | OROMUCOSAL | Status: DC | PRN
  Filled 2019-07-23: qty 177

## 2019-07-23 MED ORDER — POVIDONE-IODINE 10 % EX SWAB
2.0000 "application " | Freq: Once | CUTANEOUS | Status: AC
  Administered 2019-07-23: 2 via TOPICAL

## 2019-07-23 MED ORDER — EPHEDRINE SULFATE-NACL 50-0.9 MG/10ML-% IV SOSY
PREFILLED_SYRINGE | INTRAVENOUS | Status: DC | PRN
  Administered 2019-07-23: 10 mg via INTRAVENOUS
  Administered 2019-07-23: 5 mg via INTRAVENOUS

## 2019-07-23 MED ORDER — EPHEDRINE 5 MG/ML INJ
INTRAVENOUS | Status: AC
  Filled 2019-07-23: qty 10

## 2019-07-23 MED ORDER — SODIUM CHLORIDE 0.9 % IR SOLN
Status: DC | PRN
  Administered 2019-07-23: 1000 mL

## 2019-07-23 MED ORDER — ACETAMINOPHEN 10 MG/ML IV SOLN: 1000.0000 mg | Freq: Once | INTRAVENOUS | Status: DC | PRN

## 2019-07-23 MED ORDER — DEXAMETHASONE SODIUM PHOSPHATE 10 MG/ML IJ SOLN
10.0000 mg | Freq: Once | INTRAMUSCULAR | Status: AC
  Administered 2019-07-23: 10 mg via INTRAVENOUS

## 2019-07-23 MED ORDER — TRANEXAMIC ACID-NACL 1000-0.7 MG/100ML-% IV SOLN
INTRAVENOUS | Status: AC
  Filled 2019-07-23: qty 100

## 2019-07-23 MED ORDER — BUPIVACAINE IN DEXTROSE 0.75-8.25 % IT SOLN
INTRATHECAL | Status: DC | PRN
  Administered 2019-07-23: 1.8 mL via INTRATHECAL

## 2019-07-23 MED ORDER — STERILE WATER FOR IRRIGATION IR SOLN
Status: DC | PRN
  Administered 2019-07-23 (×2): 1000 mL

## 2019-07-23 MED ORDER — FENTANYL CITRATE (PF) 100 MCG/2ML IJ SOLN
INTRAMUSCULAR | Status: DC | PRN
  Administered 2019-07-23: 100 ug via INTRAVENOUS

## 2019-07-23 MED ORDER — ONDANSETRON HCL 4 MG PO TABS: 4.0000 mg | ORAL_TABLET | Freq: Four times a day (QID) | ORAL | Status: DC | PRN

## 2019-07-23 MED ORDER — ALUM & MAG HYDROXIDE-SIMETH 200-200-20 MG/5ML PO SUSP: 15.0000 mL | ORAL | Status: DC | PRN

## 2019-07-23 MED ORDER — FENTANYL CITRATE (PF) 100 MCG/2ML IJ SOLN
INTRAMUSCULAR | Status: AC
  Filled 2019-07-23: qty 2

## 2019-07-23 MED ORDER — TRANEXAMIC ACID-NACL 1000-0.7 MG/100ML-% IV SOLN
1000.0000 mg | Freq: Once | INTRAVENOUS | Status: AC
  Administered 2019-07-23: 1000 mg via INTRAVENOUS
  Filled 2019-07-23: qty 100

## 2019-07-23 MED ORDER — ASPIRIN 81 MG PO CHEW
81.0000 mg | CHEWABLE_TABLET | Freq: Two times a day (BID) | ORAL | Status: DC
  Administered 2019-07-23 – 2019-07-24 (×2): 81 mg via ORAL
  Filled 2019-07-23 (×2): qty 1

## 2019-07-23 SURGICAL SUPPLY — 47 items
ADH SKN CLS APL DERMABOND .7 (GAUZE/BANDAGES/DRESSINGS) ×1
BAG DECANTER FOR FLEXI CONT (MISCELLANEOUS) IMPLANT
BAG SPEC THK2 15X12 ZIP CLS (MISCELLANEOUS)
BAG ZIPLOCK 12X15 (MISCELLANEOUS) IMPLANT
BLADE SAG 18X100X1.27 (BLADE) ×2 IMPLANT
BLADE SURG SZ10 CARB STEEL (BLADE) ×4 IMPLANT
COVER PERINEAL POST (MISCELLANEOUS) ×2 IMPLANT
COVER SURGICAL LIGHT HANDLE (MISCELLANEOUS) ×2 IMPLANT
COVER WAND RF STERILE (DRAPES) IMPLANT
CUP ACETBLR 52 OD PINNACLE (Hips) ×1 IMPLANT
DERMABOND ADVANCED (GAUZE/BANDAGES/DRESSINGS) ×1
DERMABOND ADVANCED .7 DNX12 (GAUZE/BANDAGES/DRESSINGS) ×1 IMPLANT
DRAPE STERI IOBAN 125X83 (DRAPES) ×2 IMPLANT
DRAPE U-SHAPE 47X51 STRL (DRAPES) ×4 IMPLANT
DRESSING AQUACEL AG SP 3.5X10 (GAUZE/BANDAGES/DRESSINGS) ×1 IMPLANT
DRSG AQUACEL AG SP 3.5X10 (GAUZE/BANDAGES/DRESSINGS) ×2
DURAPREP 26ML APPLICATOR (WOUND CARE) ×2 IMPLANT
ELECT BLADE TIP CTD 4 INCH (ELECTRODE) ×2 IMPLANT
ELECT REM PT RETURN 15FT ADLT (MISCELLANEOUS) ×2 IMPLANT
ELIMINATOR HOLE APEX DEPUY (Hips) ×1 IMPLANT
GLOVE BIO SURGEON STRL SZ 6 (GLOVE) ×4 IMPLANT
GLOVE BIOGEL PI IND STRL 6.5 (GLOVE) ×1 IMPLANT
GLOVE BIOGEL PI IND STRL 7.5 (GLOVE) ×1 IMPLANT
GLOVE BIOGEL PI IND STRL 8.5 (GLOVE) ×1 IMPLANT
GLOVE BIOGEL PI INDICATOR 6.5 (GLOVE) ×1
GLOVE BIOGEL PI INDICATOR 7.5 (GLOVE) ×1
GLOVE BIOGEL PI INDICATOR 8.5 (GLOVE) ×1
GLOVE ECLIPSE 8.0 STRL XLNG CF (GLOVE) ×4 IMPLANT
GLOVE ORTHO TXT STRL SZ7.5 (GLOVE) ×4 IMPLANT
GOWN STRL REUS W/TWL LRG LVL3 (GOWN DISPOSABLE) ×4 IMPLANT
GOWN STRL REUS W/TWL XL LVL3 (GOWN DISPOSABLE) ×2 IMPLANT
HEAD CERAMIC DELTA 36 PLUS 1.5 (Hips) ×1 IMPLANT
HOLDER FOLEY CATH W/STRAP (MISCELLANEOUS) ×2 IMPLANT
KIT TURNOVER KIT A (KITS) IMPLANT
LINER NEUTRAL 52X36MM PLUS 4 (Liner) ×1 IMPLANT
PACK ANTERIOR HIP CUSTOM (KITS) ×2 IMPLANT
SCREW 6.5MMX30MM (Screw) ×1 IMPLANT
STEM FEMORAL SZ5 HIGH ACTIS (Stem) ×1 IMPLANT
SUT MNCRL AB 4-0 PS2 18 (SUTURE) ×2 IMPLANT
SUT STRATAFIX 0 PDS 27 VIOLET (SUTURE) ×2
SUT VIC AB 1 CT1 36 (SUTURE) ×6 IMPLANT
SUT VIC AB 2-0 CT1 27 (SUTURE) ×4
SUT VIC AB 2-0 CT1 TAPERPNT 27 (SUTURE) ×2 IMPLANT
SUTURE STRATFX 0 PDS 27 VIOLET (SUTURE) ×1 IMPLANT
TRAY FOLEY MTR SLVR 16FR STAT (SET/KITS/TRAYS/PACK) IMPLANT
WATER STERILE IRR 1000ML POUR (IV SOLUTION) ×2 IMPLANT
YANKAUER SUCT BULB TIP 10FT TU (MISCELLANEOUS) IMPLANT

## 2019-07-23 NOTE — Evaluation (Signed)
Physical Therapy Evaluation Patient Details Name: Jacqueline Ryan MRN: [id number] DOB: 1946-03-02 Today's Date: 07/23/2019   History of Present Illness  Patient is 73 y.o. female s/p Lt THA anterior approach on 07/23/19 with PMH significant for HTN, OA, and anxiety.  Clinical Impression  Jacqueline Ryan is a 73 y.o. female POD 0 s/p Lt THA anterior approach. Patient reports independence with mobility at baseline. Patient is now limited by functional impairments (see PT problem list below) and requires min assist for transfers and gait with RW. Patient was able to ambulate ~80 feet with RW and min cues/assist for safe use of RW. Patient instructed in exercise to facilitate ROM and circulation to manage edema. Patient will benefit from continued skilled PT interventions to address impairments and progress towards PLOF. Acute PT will follow to progress mobility and stair training in preparation for safe discharge home.     Follow Up Recommendations Follow surgeon's recommendation for DC plan and follow-up therapies    Equipment Recommendations  Rolling walker with 5" wheels;3in1 (PT)    Recommendations for Other Services       Precautions / Restrictions Precautions Precautions: Fall Restrictions Weight Bearing Restrictions: No      Mobility  Bed Mobility Overal bed mobility: Needs Assistance Bed Mobility: Supine to Sit     Supine to sit: Min assist;HOB elevated     General bed mobility comments: cues for sequencing and use of gait belt to assist Lt LE mobility, assist provided for Lt LE mobility as pt limited by pain  Transfers Overall transfer level: Needs assistance Equipment used: Rolling walker (2 wheeled) Transfers: Sit to/from Stand Sit to Stand: Min assist         General transfer comment: cues for safe hand placement and technique with RW, assist to initiate power up and steady with rising, pt requried encouragement to mobilize  Ambulation/Gait Ambulation/Gait  assistance: Min assist Gait Distance (Feet): 80 Feet Assistive device: Rolling walker (2 wheeled) Gait Pattern/deviations: Step-through pattern;Decreased step length - right;Decreased step length - left;Decreased stride length;Decreased stance time - left;Antalgic Gait velocity: decreased   General Gait Details: cues provided for safe hand placement at start and pt maintained throughout, no overt LOB noted during gait, cues for step pattern required at start  Stairs            Wheelchair Mobility    Modified Rankin (Stroke Patients Only)       Balance Overall balance assessment: Needs assistance Sitting-balance support: No upper extremity supported;Feet supported Sitting balance-Leahy Scale: Good     Standing balance support: During functional activity;Bilateral upper extremity supported Standing balance-Leahy Scale: Poor              Pertinent Vitals/Pain Pain Assessment: Faces Faces Pain Scale: Hurts whole lot Pain Location: Lt hip Pain Descriptors / Indicators: Aching;Sore Pain Intervention(s): Limited activity within patient's tolerance;Monitored during session;Ice applied;Repositioned;Patient requesting pain meds-RN notified    Home Living Family/patient expects to be discharged to:: Private residence Living Arrangements: Spouse/significant other Available Help at Discharge: Family(daughter coming up to stay through the weekend) Type of Home: House Home Access: Stairs to enter Entrance Stairs-Rails: Can reach both;Left;Right Entrance Stairs-Number of Steps: 2 steps from garage, 2 hand rails Home Layout: One level Home Equipment: Clinical cytogeneticist - 4 wheels Additional Comments: pt reports she was given a rollator after her hip surgery    Prior Function Level of Independence: Independent         Comments: pt was mobilizing with no device prior  to surgery     Hand Dominance        Extremity/Trunk Assessment   Upper Extremity Assessment Upper  Extremity Assessment: Overall WFL for tasks assessed    Lower Extremity Assessment Lower Extremity Assessment: LLE deficits/detail LLE Deficits / Details: pt with good quad contraction in supine and with LAQ at EOB, 3+/5 or greater for strength, pt limited by pain LLE Sensation: WNL LLE Coordination: WNL    Cervical / Trunk Assessment Cervical / Trunk Assessment: Normal  Communication   Communication: No difficulties  Cognition Arousal/Alertness: Awake/alert Behavior During Therapy: WFL for tasks assessed/performed Overall Cognitive Status: Within Functional Limits for tasks assessed                   General Comments      Exercises Total Joint Exercises Ankle Circles/Pumps: AROM;10 reps;Seated;Both Quad Sets: AROM;10 reps;Supine;Left Heel Slides: AROM;5 reps;Supine;Left   Assessment/Plan    PT Assessment Patient needs continued PT services  PT Problem List Decreased strength;Decreased balance;Decreased mobility;Decreased range of motion;Decreased knowledge of use of DME;Decreased activity tolerance       PT Treatment Interventions DME instruction;Functional mobility training;Balance training;Patient/family education;Modalities;Therapeutic activities;Gait training;Stair training;Therapeutic exercise    PT Goals (Current goals can be found in the Care Plan section)  Acute Rehab PT Goals Patient Stated Goal: to return home and improve walking PT Goal Formulation: With patient Time For Goal Achievement: 07/30/19 Potential to Achieve Goals: Good    Frequency 7X/week    AM-PAC PT "6 Clicks" Mobility  Outcome Measure Help needed turning from your back to your side while in a flat bed without using bedrails?: A Little Help needed moving from lying on your back to sitting on the side of a flat bed without using bedrails?: A Little Help needed moving to and from a bed to a chair (including a wheelchair)?: A Little Help needed standing up from a chair using your arms  (e.g., wheelchair or bedside chair)?: A Little Help needed to walk in hospital room?: A Little Help needed climbing 3-5 steps with a railing? : A Little 6 Click Score: 18    End of Session Equipment Utilized During Treatment: Gait belt Activity Tolerance: Patient tolerated treatment well Patient left: with call bell/phone within reach;in chair(RN notified batteries dead in chair alarm) Nurse Communication: Mobility status PT Visit Diagnosis: Other abnormalities of gait and mobility (R26.89);Muscle weakness (generalized) (M62.81);Difficulty in walking, not elsewhere classified (R26.2)    Time: 1411-1440 PT Time Calculation (min) (ACUTE ONLY): 29 min   Charges:   PT Evaluation $PT Eval Low Complexity: 1 Low PT Treatments $Therapeutic Exercise: 8-22 mins        Kipp Brood, PT, DPT Physical Therapist with [facility]  07/23/2019 3:07 PM

## 2019-07-23 NOTE — Transfer of Care (Signed)
Immediate Anesthesia Transfer of Care Note  Patient: Jacqueline Ryan  Procedure(s) Performed: TOTAL HIP ARTHROPLASTY ANTERIOR APPROACH (Left Hip)  Patient Location: PACU  Anesthesia Type:MAC and Spinal  Level of Consciousness: awake, alert , oriented and patient cooperative  Airway & Oxygen Therapy: Patient Spontanous Breathing and Patient connected to face mask oxygen  Post-op Assessment: Report given to RN and Post -op Vital signs reviewed and stable  Post vital signs: Reviewed and stable  Last Vitals:  Vitals Value Taken Time  BP 123/72 07/23/19 1027  Temp    Pulse 109 07/23/19 1030  Resp 19 07/23/19 1030  SpO2 100 % 07/23/19 1030  Vitals shown include unvalidated device data.  Last Pain:  Vitals:   07/23/19 0707  TempSrc:   PainSc: 0-No pain         Complications: No apparent anesthesia complications

## 2019-07-23 NOTE — Op Note (Signed)
NAME:  Jacqueline Ryan                ACCOUNT NO.: [id number]      MEDICAL RECORD NO.: [id number]      FACILITY:  [facility]      PHYSICIAN:  Mauri Pole  DATE OF BIRTH:  Sep 03, 1946     DATE OF PROCEDURE:  07/23/2019                                 OPERATIVE REPORT         PREOPERATIVE DIAGNOSIS: Left  hip osteoarthritis.      POSTOPERATIVE DIAGNOSIS:  Left hip osteoarthritis.      PROCEDURE:  Left total hip replacement through an anterior approach   utilizing DePuy THR system, component size 31mm pinnacle cup, a size 36+4 neutral   Altrex liner, a size 5 Hi Actis stem with a 36+1.5 delta ceramic   ball.      SURGEON:  Pietro Cassis. Alvan Dame, M.D.      ASSISTANT:  Griffith Citron, PA-C     ANESTHESIA:  Spinal.      SPECIMENS:  None.      COMPLICATIONS:  None.      BLOOD LOSS:  200 cc     DRAINS:  None.      INDICATION OF THE PROCEDURE:  Jacqueline Ryan is a 73 y.o. female who had   presented to office for evaluation of left hip pain.  Radiographs revealed   progressive degenerative changes with bone-on-bone   articulation of the  hip joint, including subchondral cystic changes and osteophytes.  The patient had painful limited range of   motion significantly affecting their overall quality of life and function.  The patient was failing to    respond to conservative measures including medications and/or injections and activity modification and at this point was ready   to proceed with more definitive measures.  Consent was obtained for   benefit of pain relief.  Specific risks of infection, DVT, component   failure, dislocation, neurovascular injury, and need for revision surgery were reviewed in the office as well discussion of   the anterior versus posterior approach were reviewed.     PROCEDURE IN DETAIL:  The patient was brought to operative theater.   Once adequate anesthesia, preoperative antibiotics, 2 gm of Ancef, 1 gm of Tranexamic Acid, and 10 mg of  Decadron were administered, the patient was positioned supine on the [employer] table.  Once the patient was safely positioned with adequate padding of boney prominences we predraped out the hip, and used fluoroscopy to confirm orientation of the pelvis.      The left hip was then prepped and draped from proximal iliac crest to   mid thigh with a shower curtain technique.      Time-out was performed identifying the patient, planned procedure, and the appropriate extremity.     An incision was then made 2 cm lateral to the   anterior superior iliac spine extending over the orientation of the   tensor fascia lata muscle and sharp dissection was carried down to the   fascia of the muscle.      The fascia was then incised.  The muscle belly was identified and swept   laterally and retractor placed along the superior neck.  Following   cauterization of the circumflex vessels and removing some pericapsular  fat, a second cobra retractor was placed on the inferior neck.  A T-capsulotomy was made along the line of the   superior neck to the trochanteric fossa, then extended proximally and   distally.  Tag sutures were placed and the retractors were then placed   intracapsular.  We then identified the trochanteric fossa and   orientation of my neck cut and then made a neck osteotomy with the femur on traction.  The femoral   head was removed without difficulty or complication.  Traction was let   off and retractors were placed posterior and anterior around the   acetabulum.      The labrum and foveal tissue were debrided.  I began reaming with a 45 mm   reamer and reamed up to 51 mm reamer with good bony bed preparation and a 52 mm  cup was chosen.  The final 52 mm Pinnacle cup was then impacted under fluoroscopy to confirm the depth of penetration and orientation with respect to   Abduction and forward flexion.  A screw was placed into the ilium followed by the hole eliminator.  The final   36+4  neutral Altrex liner was impacted with good visualized rim fit.  The cup was positioned anatomically within the acetabular portion of the pelvis.      At this point, the femur was rolled to 100 degrees.  Further capsule was   released off the inferior aspect of the femoral neck.  I then   released the superior capsule proximally.  With the leg in a neutral position the hook was placed laterally   along the femur under the vastus lateralis origin and elevated manually and then held in position using the hook attachment on the bed.  The leg was then extended and adducted with the leg rolled to 100   degrees of external rotation.  Retractors were placed along the medial calcar and posteriorly over the greater trochanter.  Once the proximal femur was fully   exposed, I used a box osteotome to set orientation.  I then began   broaching with the starting chili pepper broach and passed this by hand and then broached up to 5.  With the 5 broach in place I chose a high offset neck and did several trial reductions.  The offset was appropriate, leg lengths   appeared to be equal best matched with the +1.5 head ball trial confirmed radiographically. She was noted to be a little longer pre-operatively despite advanced arthritic changes.  I took this into consideration while eliminating her arthritis but attempting to restore original anatomy.  Given these findings, I went ahead and dislocated the hip, repositioned all   retractors and positioned the right hip in the extended and abducted position.  The final 5 Hi Actis stem was   chosen and it was impacted down to the level of neck cut.  Based on this   and the trial reductions, a final 36+1.5 delta ceramic ball was chosen and   impacted onto a clean and dry trunnion, and the hip was reduced.  The   hip had been irrigated throughout the case again at this point.  I did   reapproximate the superior capsular leaflet to the anterior leaflet   using #1 Vicryl.  The  fascia of the   tensor fascia lata muscle was then reapproximated using #1 Vicryl and #0 Stratafix sutures.  The   remaining wound was closed with 2-0 Vicryl and running 4-0 Monocryl.  The hip was cleaned, dried, and dressed sterilely using Dermabond and   Aquacel dressing.  The patient was then brought   to recovery room in stable condition tolerating the procedure well.    Griffith Citron, PA-C was present for the entirety of the case involved from   preoperative positioning, perioperative retractor management, general   facilitation of the case, as well as primary wound closure as assistant.            Pietro Cassis Alvan Dame, M.D.        07/23/2019 10:05 AM

## 2019-07-23 NOTE — Discharge Instructions (Signed)

## 2019-07-23 NOTE — Anesthesia Procedure Notes (Addendum)
Procedure Name: MAC Date/Time: 07/23/2019 8:35 AM Performed by: West Pugh, CRNA Pre-anesthesia Checklist: Patient identified, Emergency Drugs available, Suction available, Patient being monitored and Timeout performed Patient Re-evaluated:Patient Re-evaluated prior to induction Oxygen Delivery Method: Simple face mask Preoxygenation: Pre-oxygenation with 100% oxygen Number of attempts: 1 Placement Confirmation: positive ETCO2 Dental Injury: Teeth and Oropharynx as per pre-operative assessment

## 2019-07-23 NOTE — Anesthesia Preprocedure Evaluation (Signed)
Anesthesia Evaluation  Patient identified by MRN, date of birth, ID band Patient awake    Reviewed: Allergy & Precautions, NPO status , Patient's Chart, lab work & pertinent test results  History of Anesthesia Complications Negative for: history of anesthetic complications  Airway Mallampati: II  TM Distance: >3 FB Neck ROM: Full    Dental  (+) Dental Advisory Given, Teeth Intact   Pulmonary neg pulmonary ROS, Current SmokerPatient did not abstain from smoking.,    breath sounds clear to auscultation       Cardiovascular hypertension, Pt. on medications (-) angina(-) Past MI and (-) CHF negative cardio ROS   Rhythm:Regular     Neuro/Psych PSYCHIATRIC DISORDERS Anxiety negative neurological ROS     GI/Hepatic negative GI ROS, Neg liver ROS,   Endo/Other  negative endocrine ROS  Renal/GU negative Renal ROS     Musculoskeletal  (+) Arthritis ,   Abdominal   Peds  Hematology negative hematology ROS (+)   Anesthesia Other Findings   Reproductive/Obstetrics (+) Pregnancy                             Anesthesia Physical Anesthesia Plan  ASA: II  Anesthesia Plan: MAC and Spinal   Post-op Pain Management:    Induction:   PONV Risk Score and Plan: 1 and Treatment may vary due to age or medical condition and Propofol infusion  Airway Management Planned: Nasal Cannula  Additional Equipment: None  Intra-op Plan:   Post-operative Plan:   Informed Consent: I have reviewed the patients History and Physical, chart, labs and discussed the procedure including the risks, benefits and alternatives for the proposed anesthesia with the patient or authorized representative who has indicated his/her understanding and acceptance.     Dental advisory given  Plan Discussed with: CRNA and Surgeon  Anesthesia Plan Comments:         Anesthesia Quick Evaluation

## 2019-07-23 NOTE — Interval H&P Note (Signed)
History and Physical Interval Note:  07/23/2019 7:03 AM  Jacqueline Ryan  has presented today for surgery, with the diagnosis of Left hip osteoarthritis.  The various methods of treatment have been discussed with the patient and family. After consideration of risks, benefits and other options for treatment, the patient has consented to  Procedure(s) with comments: TOTAL HIP ARTHROPLASTY ANTERIOR APPROACH (Left) - 70 mins as a surgical intervention.  The patient's history has been reviewed, patient examined, no change in status, stable for surgery.  I have reviewed the patient's chart and labs.  Questions were answered to the patient's satisfaction.     Mauri Pole

## 2019-07-23 NOTE — Plan of Care (Signed)
Plan of care for post op day 0 discussed with patient. All questions answered.   Will continue to monitor patient.   SWhittemore, Therapist, sports

## 2019-07-23 NOTE — Anesthesia Procedure Notes (Signed)
Spinal  Patient location during procedure: OR End time: 07/23/2019 8:43 AM Staffing Resident/CRNA: Caryl Pina T, CRNA Performed: resident/CRNA  Preanesthetic Checklist Completed: patient identified, site marked, surgical consent, pre-op evaluation, timeout performed, IV checked, risks and benefits discussed and monitors and equipment checked Spinal Block Patient position: sitting Prep: DuraPrep Patient monitoring: heart rate, cardiac monitor, continuous pulse ox and blood pressure Approach: midline Location: L3-4 Injection technique: single-shot Needle Needle type: Pencan  Needle gauge: 24 G Needle length: 9 cm Assessment Sensory level: T4 Additional Notes Expiration date of kit checked and confirmed. Patient tolerated procedure well, without complications.

## 2019-07-24 ENCOUNTER — Encounter (HOSPITAL_COMMUNITY): Payer: Self-pay | Admitting: Orthopedic Surgery

## 2019-07-24 LAB — BASIC METABOLIC PANEL
Anion gap: 10 (ref 5–15)
BUN: 8 mg/dL (ref 8–23)
CO2: 24 mmol/L (ref 22–32)
Calcium: 8.3 mg/dL — ABNORMAL LOW (ref 8.9–10.3)
Chloride: 97 mmol/L — ABNORMAL LOW (ref 98–111)
Creatinine, Ser: 0.51 mg/dL (ref 0.44–1.00)
GFR calc Af Amer: >60 mL/min (ref >60)
GFR calc non Af Amer: >60 mL/min (ref >60)
Glucose, Bld: 134 mg/dL — ABNORMAL HIGH (ref 70–99)
Potassium: 3.4 mmol/L — ABNORMAL LOW (ref 3.5–5.1)
Sodium: 131 mmol/L — ABNORMAL LOW (ref 135–145)

## 2019-07-24 LAB — CBC
HCT: 37.4 % (ref 36.0–46.0)
Hemoglobin: 12.6 g/dL (ref 12.0–15.0)
MCH: 33.2 pg (ref 26.0–34.0)
MCHC: 33.7 g/dL (ref 30.0–36.0)
MCV: 98.4 fL (ref 80.0–100.0)
Platelets: 225 10*3/uL (ref 150–400)
RBC: 3.8 MIL/uL — ABNORMAL LOW (ref 3.87–5.11)
RDW: 12.9 % (ref 11.5–15.5)
WBC: 12.5 10*3/uL — ABNORMAL HIGH (ref 4.0–10.5)
nRBC: 0 % (ref 0.0–0.2)

## 2019-07-24 MED ORDER — POTASSIUM CHLORIDE CRYS ER 20 MEQ PO TBCR
20.0000 meq | EXTENDED_RELEASE_TABLET | Freq: Two times a day (BID) | ORAL | Status: DC
  Administered 2019-07-24: 20 meq via ORAL
  Filled 2019-07-24: qty 1

## 2019-07-24 MED ORDER — FERROUS SULFATE 325 (65 FE) MG PO TABS: 325.0000 mg | ORAL_TABLET | Freq: Three times a day (TID) | ORAL | 0 refills | Status: DC

## 2019-07-24 MED ORDER — DOCUSATE SODIUM 100 MG PO CAPS: 100.0000 mg | ORAL_CAPSULE | Freq: Two times a day (BID) | ORAL | 0 refills | Status: DC

## 2019-07-24 MED ORDER — METHOCARBAMOL 500 MG PO TABS: 500.0000 mg | ORAL_TABLET | Freq: Four times a day (QID) | ORAL | 0 refills | Status: DC | PRN

## 2019-07-24 MED ORDER — ASPIRIN 81 MG PO CHEW: 81.0000 mg | CHEWABLE_TABLET | Freq: Two times a day (BID) | ORAL | 0 refills | Status: AC

## 2019-07-24 MED ORDER — HYDROCODONE-ACETAMINOPHEN 5-325 MG PO TABS: 1.0000 | ORAL_TABLET | ORAL | 0 refills | Status: DC | PRN

## 2019-07-24 MED ORDER — POLYETHYLENE GLYCOL 3350 17 G PO PACK: 17.0000 g | PACK | Freq: Two times a day (BID) | ORAL | 0 refills | Status: DC

## 2019-07-24 NOTE — Progress Notes (Signed)
Physical Therapy Treatment Patient Details Name: Jacqueline Ryan MRN: [id number] DOB: January 11, 1946 Today's Date: 07/24/2019    History of Present Illness Patient is 73 y.o. female s/p Lt THA anterior approach on 07/23/19 with PMH significant for HTN, OA, and anxiety.    PT Comments    Progressing well. Reviewed/practiced exercises, gait training, and stair training. Issued HEP for pt to perform 2x/day until surgeon instructs her otherwise. All education completed. Okay to d/c from PT standpoint.    Follow Up Recommendations  Follow surgeon's recommendation for DC plan and follow-up therapies     Equipment Recommendations  Rolling walker with 5" wheels;3in1 (PT)    Recommendations for Other Services       Precautions / Restrictions Precautions Precautions: Fall Restrictions Weight Bearing Restrictions: No Other Position/Activity Restrictions: WBAT    Mobility  Bed Mobility               General bed mobility comments: oob in recliner  Transfers Overall transfer level: Needs assistance Equipment used: Rolling walker (2 wheeled) Transfers: Sit to/from Stand Sit to Stand: Min guard         General transfer comment: for safety.  Ambulation/Gait Ambulation/Gait assistance: Min guard Gait Distance (Feet): 115 Feet Assistive device: Rolling walker (2 wheeled) Gait Pattern/deviations: Step-through pattern;Decreased step length - right;Decreased step length - left;Decreased stride length;Decreased stance time - left;Antalgic     General Gait Details: for safety. slow gait speed.   Stairs Stairs: Yes Stairs assistance: Min guard Stair Management: Step to pattern;Forwards;Two rails Number of Stairs: 3 General stair comments: for safety. VCs safety, sequence. Pt prefers to come down slightly sideways.   Wheelchair Mobility    Modified Rankin (Stroke Patients Only)       Balance Overall balance assessment: Needs assistance         Standing balance  support: Bilateral upper extremity supported Standing balance-Leahy Scale: Poor                              Cognition Arousal/Alertness: Awake/alert Behavior During Therapy: WFL for tasks assessed/performed Overall Cognitive Status: Within Functional Limits for tasks assessed                                        Exercises Total Joint Exercises Ankle Circles/Pumps: AROM;10 reps;Seated;Both Quad Sets: AROM;10 reps;Seated;Both Heel Slides: AROM;Left;10 reps;Seated Hip ABduction/ADduction: AAROM;Left;10 reps;Seated;Standing Long Arc Quad: AROM;Left;10 reps;Seated Knee Flexion: AROM;Left;10 reps;Standing Marching in Standing: AROM;Left;Right;10 reps;Standing General Exercises - Lower Extremity Heel Raises: AROM;Both;10 reps;Standing    General Comments        Pertinent Vitals/Pain Pain Assessment: 0-10 Pain Score: 7  Pain Location: Lt hip Pain Descriptors / Indicators: Aching;Sore;Discomfort Pain Intervention(s): Monitored during session;Ice applied;Repositioned    Home Living                      Prior Function            PT Goals (current goals can now be found in the care plan section) Progress towards PT goals: Progressing toward goals    Frequency    7X/week      PT Plan Current plan remains appropriate    Co-evaluation              AM-PAC PT "6 Clicks" Mobility   Outcome Measure  Help needed turning from  your back to your side while in a flat bed without using bedrails?: A Little Help needed moving from lying on your back to sitting on the side of a flat bed without using bedrails?: A Little Help needed moving to and from a bed to a chair (including a wheelchair)?: A Little Help needed standing up from a chair using your arms (e.g., wheelchair or bedside chair)?: A Little Help needed to walk in hospital room?: A Little Help needed climbing 3-5 steps with a railing? : A Little 6 Click Score: 18    End of  Session Equipment Utilized During Treatment: Gait belt Activity Tolerance: Patient tolerated treatment well Patient left: with call bell/phone within reach;in chair;with chair alarm set   PT Visit Diagnosis: Other abnormalities of gait and mobility (R26.89);Muscle weakness (generalized) (M62.81);Difficulty in walking, not elsewhere classified (R26.2)     Time: [id number] PT Time Calculation (min) (ACUTE ONLY): 35 min  Charges:  $Gait Training: 8-22 mins $Therapeutic Exercise: 8-22 mins                        Weston Anna, PT Acute Rehabilitation Services Pager: [phone] Office: [phone]

## 2019-07-24 NOTE — Progress Notes (Signed)
RW and 3 in1 delivered to patient room by [employer]

## 2019-07-24 NOTE — Progress Notes (Signed)
     Subjective: 1 Day Post-Op Procedure(s) (LRB): TOTAL HIP ARTHROPLASTY ANTERIOR APPROACH (Left)   Patient reports pain as mild, pain controlled with medication.  No reported events throughout the night, other than not sleeping well.  We have discussed the procedure, findings and expectations moving forward.  Patient feels that she is doing well enough to be discharged home, she does well therapy.  Patient follow-up in the clinic in 2 weeks.  Patient knows to call with any questions or concerns.    Objective:   VITALS:   Vitals:   07/24/19 0039 07/24/19 0427  BP: 122/81 (!) 157/81  Pulse: 89 95  Resp: 16 18  Temp: 97.6 F (36.4 C) 97.8 F (36.6 C)  SpO2: 99% 97%    Dorsiflexion/Plantar flexion intact Incision: dressing C/D/I No cellulitis present Compartment soft  LABS Recent Labs    07/24/19 0532  HGB 12.6  HCT 37.4  WBC 12.5*  PLT 225    Recent Labs    07/24/19 0532  NA 131*  K 3.4*  BUN 8  CREATININE 0.51  GLUCOSE 134*     Assessment/Plan: 1 Day Post-Op Procedure(s) (LRB): TOTAL HIP ARTHROPLASTY ANTERIOR APPROACH (Left) Foley cath DC'd Advance diet Up with therapy D/C IV fluids Discharge home Follow up in 2 weeks at [facility] ([place] Follow up with OLIN,Adjoa Althouse D in 2 weeks.  Contact information:  [facility]) [address] [place] [id number]    Overweight (BMI 25-29.9) Estimated body mass index is 27.29 kg/m as calculated from the following:   Height as of this encounter: 5\' 4"  (1.626 m).   Weight as of this encounter: 72.1 kg. Patient also counseled that weight may inhibit the healing process Patient counseled that losing weight will help with future health issues  Hypokalemia Treated with oral potassium and will observe       West Pugh. Edison Wollschlager   PAC  07/24/2019, 9:34 AM

## 2019-07-24 NOTE — Plan of Care (Signed)
  Problem: Education: Goal: Knowledge of the prescribed therapeutic regimen will improve Outcome: Progressing   Problem: Activity: Goal: Ability to avoid complications of mobility impairment will improve Outcome: Progressing   Problem: Activity: Goal: Ability to tolerate increased activity will improve Outcome: Progressing   Problem: Clinical Measurements: Goal: Postoperative complications will be avoided or minimized Outcome: Progressing   Problem: Pain Management: Goal: Pain level will decrease with appropriate interventions Outcome: Progressing   

## 2019-07-25 NOTE — Discharge Summary (Signed)
Physician Discharge Summary  Patient ID: Jacqueline Ryan MRN: [id number] DOB/AGE: 1946-09-30 73 y.o.  Admit date: 07/23/2019 Discharge date: 07/24/2019   Procedures:  Procedure(s) (LRB): TOTAL HIP ARTHROPLASTY ANTERIOR APPROACH (Left)  Attending Physician:  Dr. Paralee Cancel   Admission Diagnoses:    Left hip primary OA / pain  Discharge Diagnoses:  Active Problems:   S/P left THA, AA   Status post total hip replacement, left  Past Medical History:  Diagnosis Date  . Anxiety   . Arthritis   . Hepatitis    age 89  . Hypertension     HPI:    [name], 73 y.o. female, has a history of pain and functional disability in the left hip(s) due to arthritis and patient has failed non-surgical conservative treatments for greater than 12 weeks to include NSAID's and/or analgesics, corticosteriod injections and activity modification.  Onset of symptoms was gradual starting  years ago with gradually worsening course since that time.The patient noted no past surgery on the left hip(s).  Patient currently rates pain in the left hip at 8 out of 10 with activity. Patient has worsening of pain with activity and weight bearing, trendelenberg gait, pain that interfers with activities of daily living and pain with passive range of motion. Patient has evidence of periarticular osteophytes and joint space narrowing by imaging studies. This condition presents safety issues increasing the risk of falls.  There is no current active infection.  Risks, benefits and expectations were discussed with the patient.  Risks including but not limited to the risk of anesthesia, blood clots, nerve damage, blood vessel damage, failure of the prosthesis, infection and up to and including death.  Patient understand the risks, benefits and expectations and wishes to proceed with surgery.   PCP: Camille Bal, PA-C   Discharged Condition: good  Hospital Course:  Patient underwent the above stated procedure on  07/23/2019. Patient tolerated the procedure well and brought to the recovery room in good condition and subsequently to the floor.  POD #1 BP: 157/81 ; Pulse: 95 ; Temp: 97.8 F (36.6 C) ; Resp: 18 Patient reports pain as mild, pain controlled with medication.  No reported events throughout the night, other than not sleeping well.  We have discussed the procedure, findings and expectations moving forward.  Patient feels that she is doing well enough to be discharged home. Dorsiflexion/plantar flexion intact, incision: dressing C/D/I, no cellulitis present and compartment soft.   LABS  Basename    HGB     12.6  HCT     37.4    Discharge Exam: General appearance: alert, cooperative and no distress Extremities: Homans sign is negative, no sign of DVT, no edema, redness or tenderness in the calves or thighs and no ulcers, gangrene or trophic changes  Disposition:  Home with follow up in 2 weeks   Follow-up Information    Paralee Cancel, MD. Schedule an appointment as soon as possible for a visit in 2 weeks.   Specialty: Orthopedic Surgery Contact information: [address] [place] [address] [id number]           Discharge Instructions    Call MD / Call 911   Complete by: As directed    If you experience chest pain or shortness of breath, CALL 911 and be transported to the hospital emergency room.  If you develope a fever above 101 F, pus (white drainage) or increased drainage or redness at the wound, or calf pain, call  your surgeon's office.   Change dressing   Complete by: As directed    Maintain surgical dressing until follow up in the clinic. If the edges start to pull up, may reinforce with tape. If the dressing is no longer working, may remove and cover with gauze and tape, but must keep the area dry and clean.  Call with any questions or concerns.   Constipation Prevention   Complete by: As directed    Drink plenty of fluids.  Prune juice may be  helpful.  You may use a stool softener, such as Colace (over the counter) 100 mg twice a day.  Use MiraLax (over the counter) for constipation as needed.   Diet - low sodium heart healthy   Complete by: As directed    Discharge instructions   Complete by: As directed    Maintain surgical dressing until follow up in the clinic. If the edges start to pull up, may reinforce with tape. If the dressing is no longer working, may remove and cover with gauze and tape, but must keep the area dry and clean.  Follow up in 2 weeks at [facility]. Call with any questions or concerns.   Increase activity slowly as tolerated   Complete by: As directed    Weight bearing as tolerated with assist device (walker, cane, etc) as directed, use it as long as suggested by your surgeon or therapist, typically at least 4-6 weeks.   TED hose   Complete by: As directed    Use stockings (TED hose) for 2 weeks on both leg(s).  You may remove them at night for sleeping.      Allergies as of 07/24/2019   No Known Allergies     Medication List    TAKE these medications   amLODipine 2.5 MG tablet Commonly known as: NORVASC Take 2.5 mg by mouth at bedtime.   aspirin 81 MG chewable tablet Commonly known as: Aspirin Childrens Chew 1 tablet (81 mg total) by mouth 2 (two) times daily. Take for 4 weeks, then resume regular dose.   docusate sodium 100 MG capsule Commonly known as: Colace Take 1 capsule (100 mg total) by mouth 2 (two) times daily.   ferrous sulfate 325 (65 FE) MG tablet Commonly known as: FerrouSul Take 1 tablet (325 mg total) by mouth 3 (three) times daily with meals for 14 days.   HYDROcodone-acetaminophen 5-325 MG tablet Commonly known as: Norco Take 1-2 tablets by mouth every 4 (four) hours as needed for moderate pain or severe pain.   LORazepam 0.5 MG tablet Commonly known as: ATIVAN Take 0.5 mg by mouth every 8 (eight) hours.   losartan-hydrochlorothiazide 100-12.5 MG tablet  Commonly known as: HYZAAR Take 1 tablet by mouth daily.   methocarbamol 500 MG tablet Commonly known as: Robaxin Take 1 tablet (500 mg total) by mouth every 6 (six) hours as needed for muscle spasms.   polyethylene glycol 17 g packet Commonly known as: MIRALAX / GLYCOLAX Take 17 g by mouth 2 (two) times daily.   PRESERVISION/LUTEIN PO Take 1 capsule by mouth daily.   multivitamin-lutein Caps capsule Take 1 capsule by mouth daily.   Vitamin D (Ergocalciferol) 1.25 MG (50000 UT) Caps capsule Commonly known as: DRISDOL Take 50,000 Units by mouth every 7 (seven) days.            Discharge Care Instructions  (From admission, onward)         Start     Ordered   07/24/19  0000  Change dressing    Comments: Maintain surgical dressing until follow up in the clinic. If the edges start to pull up, may reinforce with tape. If the dressing is no longer working, may remove and cover with gauze and tape, but must keep the area dry and clean.  Call with any questions or concerns.   07/24/19 0941           Signed: West Pugh. Julina Altmann   PA-C  07/25/2019, 8:49 AM

## 2019-07-26 ENCOUNTER — Emergency Department (HOSPITAL_COMMUNITY)
Admission: EM | Admit: 2019-07-26 | Discharge: 2019-07-26 | Disposition: A | Discharge status: Home / Self Care | Payer: Medicare HMO | Attending: Emergency Medicine | Admitting: Emergency Medicine

## 2019-07-26 ENCOUNTER — Other Ambulatory Visit: Payer: Self-pay

## 2019-07-26 ENCOUNTER — Encounter (HOSPITAL_COMMUNITY): Payer: Self-pay | Admitting: Radiology

## 2019-07-26 ENCOUNTER — Emergency Department (HOSPITAL_COMMUNITY): Payer: Medicare HMO

## 2019-07-26 DIAGNOSIS — R079 Chest pain, unspecified: Secondary | ICD-10-CM | POA: Diagnosis not present

## 2019-07-26 DIAGNOSIS — F17210 Nicotine dependence, cigarettes, uncomplicated: Secondary | ICD-10-CM | POA: Insufficient documentation

## 2019-07-26 DIAGNOSIS — Z20828 Contact with and (suspected) exposure to other viral communicable diseases: Secondary | ICD-10-CM | POA: Insufficient documentation

## 2019-07-26 DIAGNOSIS — R069 Unspecified abnormalities of breathing: Secondary | ICD-10-CM | POA: Diagnosis not present

## 2019-07-26 DIAGNOSIS — J80 Acute respiratory distress syndrome: Secondary | ICD-10-CM | POA: Diagnosis not present

## 2019-07-26 DIAGNOSIS — I10 Essential (primary) hypertension: Secondary | ICD-10-CM | POA: Diagnosis not present

## 2019-07-26 DIAGNOSIS — J40 Bronchitis, not specified as acute or chronic: Secondary | ICD-10-CM | POA: Diagnosis not present

## 2019-07-26 DIAGNOSIS — R0600 Dyspnea, unspecified: Secondary | ICD-10-CM | POA: Diagnosis not present

## 2019-07-26 DIAGNOSIS — R0689 Other abnormalities of breathing: Secondary | ICD-10-CM | POA: Diagnosis not present

## 2019-07-26 DIAGNOSIS — Z79899 Other long term (current) drug therapy: Secondary | ICD-10-CM | POA: Diagnosis not present

## 2019-07-26 DIAGNOSIS — R0902 Hypoxemia: Secondary | ICD-10-CM | POA: Diagnosis not present

## 2019-07-26 DIAGNOSIS — Z96642 Presence of left artificial hip joint: Secondary | ICD-10-CM | POA: Insufficient documentation

## 2019-07-26 DIAGNOSIS — R69 Illness, unspecified: Secondary | ICD-10-CM | POA: Diagnosis not present

## 2019-07-26 DIAGNOSIS — R000 Tachycardia, unspecified: Secondary | ICD-10-CM | POA: Diagnosis not present

## 2019-07-26 DIAGNOSIS — R0602 Shortness of breath: Secondary | ICD-10-CM | POA: Diagnosis not present

## 2019-07-26 DIAGNOSIS — J441 Chronic obstructive pulmonary disease with (acute) exacerbation: Secondary | ICD-10-CM | POA: Diagnosis not present

## 2019-07-26 LAB — CBC WITH DIFFERENTIAL/PLATELET
Abs Immature Granulocytes: 0.1 10*3/uL — ABNORMAL HIGH (ref 0.00–0.07)
Basophils Absolute: 0.1 10*3/uL (ref 0.0–0.1)
Basophils Relative: 1 %
Eosinophils Absolute: 0 10*3/uL (ref 0.0–0.5)
Eosinophils Relative: 0 %
HCT: 36.9 % (ref 36.0–46.0)
Hemoglobin: 12.5 g/dL (ref 12.0–15.0)
Immature Granulocytes: 1 %
Lymphocytes Relative: 12 %
Lymphs Abs: 1.4 10*3/uL (ref 0.7–4.0)
MCH: 33.2 pg (ref 26.0–34.0)
MCHC: 33.9 g/dL (ref 30.0–36.0)
MCV: 97.9 fL (ref 80.0–100.0)
Monocytes Absolute: 1.2 10*3/uL — ABNORMAL HIGH (ref 0.1–1.0)
Monocytes Relative: 10 %
Neutro Abs: 8.8 10*3/uL — ABNORMAL HIGH (ref 1.7–7.7)
Neutrophils Relative %: 76 %
Platelets: 230 10*3/uL (ref 150–400)
RBC: 3.77 MIL/uL — ABNORMAL LOW (ref 3.87–5.11)
RDW: 12.7 % (ref 11.5–15.5)
WBC: 11.5 10*3/uL — ABNORMAL HIGH (ref 4.0–10.5)
nRBC: 0 % (ref 0.0–0.2)

## 2019-07-26 LAB — BASIC METABOLIC PANEL
Anion gap: 11 (ref 5–15)
BUN: 10 mg/dL (ref 8–23)
CO2: 26 mmol/L (ref 22–32)
Calcium: 8.5 mg/dL — ABNORMAL LOW (ref 8.9–10.3)
Chloride: 90 mmol/L — ABNORMAL LOW (ref 98–111)
Creatinine, Ser: 0.35 mg/dL — ABNORMAL LOW (ref 0.44–1.00)
GFR calc Af Amer: >60 mL/min (ref >60)
GFR calc non Af Amer: >60 mL/min (ref >60)
Glucose, Bld: 121 mg/dL — ABNORMAL HIGH (ref 70–99)
Potassium: 3.5 mmol/L (ref 3.5–5.1)
Sodium: 127 mmol/L — ABNORMAL LOW (ref 135–145)

## 2019-07-26 LAB — BLOOD GAS, VENOUS
Acid-Base Excess: 4.6 mmol/L — ABNORMAL HIGH (ref 0.0–2.0)
Bicarbonate: 28.5 mmol/L — ABNORMAL HIGH (ref 20.0–28.0)
O2 Saturation: 81.1 %
Patient temperature: 98.6
pCO2, Ven: 41.2 mmHg — ABNORMAL LOW (ref 44.0–60.0)
pH, Ven: 7.455 — ABNORMAL HIGH (ref 7.250–7.430)
pO2, Ven: 42.8 mmHg (ref 32.0–45.0)

## 2019-07-26 LAB — TROPONIN I (HIGH SENSITIVITY): Troponin I (High Sensitivity): 5 ng/L (ref <18)

## 2019-07-26 LAB — SARS CORONAVIRUS 2 BY RT PCR (HOSPITAL ORDER, PERFORMED IN ~~LOC~~ HOSPITAL LAB): SARS Coronavirus 2: NEGATIVE

## 2019-07-26 LAB — BRAIN NATRIURETIC PEPTIDE: B Natriuretic Peptide: 88 pg/mL (ref 0.0–100.0)

## 2019-07-26 LAB — SARS CORONAVIRUS 2 (TAT 6-24 HRS): SARS Coronavirus 2: NEGATIVE

## 2019-07-26 MED ORDER — IOHEXOL 350 MG/ML SOLN
100.0000 mL | Freq: Once | INTRAVENOUS | Status: AC | PRN
  Administered 2019-07-26: 73 mL via INTRAVENOUS

## 2019-07-26 MED ORDER — SODIUM CHLORIDE 0.9 % IV BOLUS
1000.0000 mL | Freq: Once | INTRAVENOUS | Status: AC
  Administered 2019-07-26: 1000 mL via INTRAVENOUS

## 2019-07-26 MED ORDER — IPRATROPIUM-ALBUTEROL 0.5-2.5 (3) MG/3ML IN SOLN
3.0000 mL | Freq: Three times a day (TID) | RESPIRATORY_TRACT | Status: DC | PRN
  Administered 2019-07-26 (×2): 3 mL via RESPIRATORY_TRACT
  Filled 2019-07-26 (×2): qty 3

## 2019-07-26 MED ORDER — ALBUTEROL SULFATE HFA 108 (90 BASE) MCG/ACT IN AERS: 1.0000 | INHALATION_SPRAY | Freq: Four times a day (QID) | RESPIRATORY_TRACT | 2 refills | Status: DC | PRN

## 2019-07-26 MED ORDER — E-Z SPACER DEVI: 2 refills | Status: DC

## 2019-07-26 MED ORDER — METHYLPREDNISOLONE 4 MG PO TBPK: ORAL_TABLET | ORAL | 0 refills | Status: DC

## 2019-07-26 MED ORDER — SODIUM CHLORIDE (PF) 0.9 % IJ SOLN
INTRAMUSCULAR | Status: AC
  Filled 2019-07-26: qty 50

## 2019-07-26 NOTE — ED Triage Notes (Signed)
Pt arrives to ED via [facility] coming from home, pt c/o SHOB accompanied by a cough since having her hip replacement done. Pt also states she coughed up some blood yesterday. EMS reports pt had some wheezing upon their arrival and they administer 125 mg of Solumedrol and 2 g of mag with some improvement in work of breathing but wheezing still present in all fields.

## 2019-07-26 NOTE — ED Provider Notes (Signed)
[facility] DEPT Provider Note   CSN: [id number] Arrival date & time: 07/26/19  [id number]     History   Chief Complaint Chief Complaint  Patient presents with  . Shortness of Breath    HPI Jacqueline Ryan is a 73 y.o. female with a history of anxiety, left hip replacement on October 13 (3 days ago), hypertension, presented to the emergency department shortness of breath.  The patient reports that the day after returning home from the hospital, on October 14 towards the evening, she began coughing and feeling short of breath.  She noticed some blood tinges in her sputum.  She believes that this was "another round of bronchitis" and states that she has had bronchitis in the past.  She states she smokes about 1 pack of cigarettes per day.   SHe states the shortness of breath is been ongoing for the past 2 days, but the coughing has improved.  She denies any fevers.  She denies any sick contacts.  She reports she was tested negative for Covid prior to her procedure.  The patient was found to be wheezing by EMS when they picked her up.  She was not given any breathing treatments per their protocol, however she was given IV Solu-Medrol 125 mg as well as IV magnesium (unclear dosage).  She denies any history of asthma or COPD but says she smokes 1 pack of cigarettes per day.  She is mostly sedentary at baseline and is unable to tell me how far she can typically walk prior to feeling short of breath.  She does not wear oxygen or use BiPAP at night.  Currently in the ED she is denying chest pain.  She is denying diaphoresis or lightheadedness.  She is denying nausea vomiting or diarrhea.  She is denying myalgias.  She denies any prior history of DVT or PE.  She is not on blood thinners.  She denies any history of cardiac disease including MI or stents.  She denies any history of angina.     HPI  Past Medical History:  Diagnosis Date  . Anxiety   . Arthritis   .  Hepatitis    age 71  . Hypertension     Patient Active Problem List   Diagnosis Date Noted  . S/P left THA, AA 07/23/2019  . Status post total hip replacement, left 07/23/2019    Past Surgical History:  Procedure Laterality Date  . ABDOMINAL HYSTERECTOMY    . EYE SURGERY    . TONSILLECTOMY    . TOTAL HIP ARTHROPLASTY Left 07/23/2019   Procedure: TOTAL HIP ARTHROPLASTY ANTERIOR APPROACH;  Surgeon: Paralee Cancel, MD;  Location: [facility];  Service: Orthopedics;  Laterality: Left;  70 mins     OB History   No obstetric history on file.      Home Medications    Prior to Admission medications   Medication Sig Start Date End Date Taking? Authorizing Provider  albuterol (VENTOLIN HFA) 108 (90 Base) MCG/ACT inhaler Inhale 1-2 puffs into the lungs every 6 (six) hours as needed for wheezing or shortness of breath. 07/26/19   Wyvonnia Dusky, MD  amLODipine (NORVASC) 2.5 MG tablet Take 2.5 mg by mouth at bedtime.  06/21/18   [provider]  aspirin (ASPIRIN CHILDRENS) 81 MG chewable tablet Chew 1 tablet (81 mg total) by mouth 2 (two) times daily. Take for 4 weeks, then resume regular dose. 07/25/19 08/24/19  Danae Orleans, PA-C  docusate sodium (COLACE) 100 MG  capsule Take 1 capsule (100 mg total) by mouth 2 (two) times daily. 07/24/19   Danae Orleans, PA-C  ferrous sulfate (FERROUSUL) 325 (65 FE) MG tablet Take 1 tablet (325 mg total) by mouth 3 (three) times daily with meals for 14 days. 07/24/19 08/07/19  Danae Orleans, PA-C  HYDROcodone-acetaminophen (NORCO) 5-325 MG tablet Take 1-2 tablets by mouth every 4 (four) hours as needed for moderate pain or severe pain. 07/24/19   Danae Orleans, PA-C  LORazepam (ATIVAN) 0.5 MG tablet Take 0.5 mg by mouth every 8 (eight) hours.    [provider]  losartan-hydrochlorothiazide (HYZAAR) 100-12.5 MG tablet Take 1 tablet by mouth daily.    [provider]  methocarbamol (ROBAXIN) 500 MG tablet Take 1 tablet (500  mg total) by mouth every 6 (six) hours as needed for muscle spasms. 07/24/19   Danae Orleans, PA-C  methylPREDNISolone (MEDROL DOSEPAK) 4 MG TBPK tablet Take as directed on package 07/27/19   Wyvonnia Dusky, MD  Multiple Vitamins-Minerals (PRESERVISION/LUTEIN PO) Take 1 capsule by mouth daily.    [provider]  multivitamin-lutein (OCUVITE-LUTEIN) CAPS capsule Take 1 capsule by mouth daily.    [provider]  polyethylene glycol (MIRALAX / GLYCOLAX) 17 g packet Take 17 g by mouth 2 (two) times daily. 07/24/19   Danae Orleans, PA-C  Spacer/Aero-Holding Chambers (E-Z SPACER) inhaler Use as instructed 07/26/19   Wyvonnia Dusky, MD  Vitamin D, Ergocalciferol, (DRISDOL) 1.25 MG (50000 UT) CAPS capsule Take 50,000 Units by mouth every 7 (seven) days.    [provider]    Family History No family history on file.  Social History Social History   Tobacco Use  . Smoking status: Current Every Day Smoker    Packs/day: 1.00    Years: 15.00    Pack years: 15.00    Types: Cigarettes  . Smokeless tobacco: Never Used  Substance Use Topics  . Alcohol use: Not Currently    Alcohol/week: 9.0 standard drinks    Types: 9 Cans of beer per week  . Drug use: Never     Allergies   Patient has no known allergies.   Review of Systems Review of Systems  Constitutional: Negative for chills and fever.  Eyes: Negative for photophobia and visual disturbance.  Respiratory: Positive for cough, chest tightness and shortness of breath.   Cardiovascular: Negative for chest pain, palpitations and leg swelling.  Gastrointestinal: Negative for abdominal pain, nausea and vomiting.  Musculoskeletal: Negative for arthralgias and myalgias.  Skin: Negative for pallor and rash.  Neurological: Negative for syncope and light-headedness.  Psychiatric/Behavioral: Negative for agitation and confusion.  All other systems reviewed and are negative.    Physical Exam Updated Vital  Signs BP 140/76   Pulse (!) 107   Temp 98.1 F (36.7 C) (Oral)   Resp (!) 22   Ht 5\' 4"  (1.626 m)   Wt 72.1 kg   SpO2 96%   BMI 27.28 kg/m   Physical Exam Vitals signs and nursing note reviewed.  Constitutional:      General: She is not in acute distress.    Appearance: She is well-developed.  HENT:     Head: Normocephalic and atraumatic.  Eyes:     Conjunctiva/sclera: Conjunctivae normal.  Neck:     Musculoskeletal: Neck supple.  Cardiovascular:     Rate and Rhythm: Regular rhythm. Tachycardia present.     Pulses: Normal pulses.  Pulmonary:     Effort: Pulmonary effort is normal. No tachypnea, accessory  muscle usage or respiratory distress.     Breath sounds: Wheezing present. No rhonchi or rales.     Comments: Bilateral end expiratory wheezing 92% O2 on room air Abdominal:     Palpations: Abdomen is soft.     Tenderness: There is no abdominal tenderness.  Musculoskeletal:     Right lower leg: No edema.     Left lower leg: No edema.  Skin:    General: Skin is warm and dry.  Neurological:     General: No focal deficit present.     Mental Status: She is alert and oriented to person, place, and time.  Psychiatric:        Mood and Affect: Mood normal.        Behavior: Behavior normal.      ED Treatments / Results  Labs (all labs ordered are listed, but only abnormal results are displayed) Labs Reviewed  CBC WITH DIFFERENTIAL/PLATELET - Abnormal; Notable for the following components:      Result Value   WBC 11.5 (*)    RBC 3.77 (*)    Neutro Abs 8.8 (*)    Monocytes Absolute 1.2 (*)    Abs Immature Granulocytes 0.10 (*)    All other components within normal limits  BASIC METABOLIC PANEL - Abnormal; Notable for the following components:   Sodium 127 (*)    Chloride 90 (*)    Glucose, Bld 121 (*)    Creatinine, Ser 0.35 (*)    Calcium 8.5 (*)    All other components within normal limits  BLOOD GAS, VENOUS - Abnormal; Notable for the following components:    pH, Ven 7.455 (*)    pCO2, Ven 41.2 (*)    Bicarbonate 28.5 (*)    Acid-Base Excess 4.6 (*)    All other components within normal limits  SARS CORONAVIRUS 2 (TAT 6-24 HRS)  SARS CORONAVIRUS 2 BY RT PCR (HOSPITAL ORDER, [facility])  BRAIN NATRIURETIC PEPTIDE  TROPONIN I (HIGH SENSITIVITY)    EKG EKG Interpretation  Date/Time:  Friday July 26 2019 07:07:56 EDT Ventricular Rate:  109 PR Interval:    QRS Duration: 82 QT Interval:  329 QTC Calculation: 443 R Axis:   94 Text Interpretation:  Sinus tachycardia Probable left atrial enlargement Low voltage, extremity leads No STEMI  Confirmed by Octaviano Glow [phone]) on 07/26/2019 7:48:10 AM Also confirmed by Octaviano Glow [phone]), editor Hattie Perch [phone])  on 07/26/2019 2:06:50 PM   Radiology Dg Chest 2 View  Result Date: 07/26/2019 CLINICAL DATA:  Suspected sepsis, new onset dyspnea EXAM: CHEST - 2 VIEW COMPARISON:  04/02/2017 chest radiograph. FINDINGS: Stable cardiomediastinal silhouette with top-normal heart size. No pneumothorax. No pleural effusion. Minimal scarring versus atelectasis at the lung bases. No pulmonary edema. No acute consolidative airspace disease. IMPRESSION: Minimal bibasilar scarring versus atelectasis. Electronically Signed   By: Ilona Sorrel M.D.   On: 07/26/2019 07:55   Ct Angio Chest Pe W/cm &/or Wo Cm  Result Date: 07/26/2019 CLINICAL DATA:  Chest pain.  Evaluate for pulmonary embolism. EXAM: CT ANGIOGRAPHY CHEST WITH CONTRAST TECHNIQUE: Multidetector CT imaging of the chest was performed using the standard protocol during bolus administration of intravenous contrast. Multiplanar CT image reconstructions and MIPs were obtained to evaluate the vascular anatomy. CONTRAST:  73 mL OMNIPAQUE IOHEXOL 350 MG/ML SOLN COMPARISON:  Chest radiograph-07/26/2019 FINDINGS: Vascular Findings: There is adequate opacification of the pulmonary arterial system with the main pulmonary  artery measuring 277 Hounsfield units. There  are no discrete filling defects within the pulmonary arterial tree to the level the bilateral subsegmental pulmonary arteries. Evaluation of the distal subsegmental pulmonary arteries is degraded secondary to patient respiratory artifact. Normal caliber of the main pulmonary artery. Borderline cardiomegaly. Calcifications of the mitral valve annulus. No pericardial effusion. Scattered atherosclerotic plaque within a tortuous but normal caliber thoracic aorta. No evidence of thoracic aortic dissection or periaortic stranding this nongated examination. Conventional configuration of the aortic arch. The branch vessels of the aortic arch appear patent throughout their imaged courses. Review of the MIP images confirms the above findings. ---------------------------------------------------------------------------------- Nonvascular Findings: Mediastinum/Lymph Nodes: No bulky mediastinal, hilar or axillary lymphadenopathy. Lungs/Pleura: Evaluation the pulmonary parenchyma is minimally degraded secondary to patient respiratory artifact. They are scattered bilateral ill-defined areas of ground-glass with perihilar predominant (right upper lobe - image 39, series 10; right middle lobe - image 100; right lower lobe - image 72, left lower lobe - image 72 and lingula - image 83). Minimal subsegmental atelectasis involving the medial basilar segments of the bilateral lower lobes as well as the bilateral costophrenic angles. No discrete air bronchograms. No pleural effusion or pneumothorax. The central pulmonary airways appear widely patent. No discrete pulmonary nodules given limitation of the examination. Upper abdomen: Moderate to large-sized hiatal hernia. Musculoskeletal: No acute or aggressive osseous abnormalities. Accentuated thoracic kyphosis with mild-to-moderate scoliotic curvature. Asymmetric enlargement of the left lobe of the thyroid in comparison to the right with several  scattered hypoattenuating nodules with dominant nodule involving the inferior aspect of the left lobe of the thyroid measuring approximately 1.2 cm in diameter (image 19, series 11, of doubtful clinical concern. Regional soft tissues appear otherwise normal. IMPRESSION: 1. No evidence of pulmonary embolism. 2. Scattered bilateral perihilar predominant ground-glass opacities, nonspecific though could be seen in the setting of early atypical infection. Clinical correlation is advised. 3.  Aortic Atherosclerosis (ICD10-I70.0). 4. Moderate to large-sized hiatal hernia. Electronically Signed   By: Sandi Mariscal M.D.   On: 07/26/2019 09:12    Procedures Procedures (including critical care time)  Medications Ordered in ED Medications  sodium chloride 0.9 % bolus 1,000 mL (0 mLs Intravenous Stopped 07/26/19 1048)  iohexol (OMNIPAQUE) 350 MG/ML injection 100 mL (73 mLs Intravenous Contrast Given 07/26/19 0849)     Initial Impression / Assessment and Plan / ED Course  I have reviewed the triage vital signs and the nursing notes.  Pertinent labs & imaging results that were available during my care of the patient were reviewed by me and considered in my medical decision making (see chart for details).   73 year old female presenting 3 days postop from left hip replacement with shortness of breath and cough.  She is mildly hypoxic with an O2 sat of 92% on room air, as well as mildly tachycardic with a heart rate of 110.  She also reports blood-tinged coughing 2 days ago.  Collectively this most concerning for pulmonary embolism.  Will obtain a CT angiogram.  We will also treat her wheezing with DuoNeb's, she has not yet received.  Given that she smokes a pack per day I suspect there is some component of COPD at baseline.  We will check a venous gas.  Also check EKG and troponins.  We will check a CBC.  I have an overall low clinical suspicion for COVID-19 on her initial presentation. The patient was tested  3 days ago, prior to her procedure.  She has no fevers or constitutional symptoms.  Will reassess this risk if  there are clinically significant CT findings.  Clinical Course as of Jul 25 1744  Fri Jul 26, 2019  0940 IMPRESSION: 1. No evidence of pulmonary embolism. 2. Scattered bilateral perihilar predominant ground-glass opacities, nonspecific though could be seen in the setting of early atypical infection. Clinical correlation is advised. 3. Aortic Atherosclerosis (ICD10-I70.0). 4. Moderate to large-sized hiatal hernia.   [MT]  1039 Patient is feeling better after single DuoNeb.  She is continues to have expiratory wheezing bilaterally.  We will give her another 2 rounds of DuoNeb and take a listen.   [MT]  [id number] With her scattered bilateral opacities seen on CT scan I believe it is reasonable to send another Covid test.  The patient was tested negative prior to her procedure 3 days ago.  She has been self isolating at home with just her husband.  My overall suspicion for Covid is low, however I believe it is reasonable to recheck it prior to her discharge today.  She does live with her husband who is elderly and may be a potential vulnerable population if she is positive.   [MT]  [place] was evaluated in Emergency Department on 07/26/2019 for the symptoms described in the history of present illness. She was evaluated in the context of the global COVID-19 pandemic, which necessitated consideration that the patient might be at risk for infection with the SARS-CoV-2 virus that causes COVID-19. Institutional protocols and algorithms that pertain to the evaluation of patients at risk for COVID-19 are in a state of rapid change based on information released by regulatory bodies including the CDC and federal and state organizations. These policies and algorithms were followed during the patient's care in the ED.    [MT]  1211 Improved wheezing after duonebs.  Will discharge with medrol dose  pack, albuterol pump.    [MT]    Clinical Course User Index [MT] Raphel Stickles, Carola Rhine, MD    Final Clinical Impressions(s) / ED Diagnoses   Final diagnoses:  COPD exacerbation [facility])  Bronchitis    ED Discharge Orders         Ordered    methylPREDNISolone (MEDROL DOSEPAK) 4 MG TBPK tablet     07/26/19 1216    albuterol (VENTOLIN HFA) 108 (90 Base) MCG/ACT inhaler  Every 6 hours PRN     07/26/19 1216    Spacer/Aero-Holding Chambers (E-Z SPACER) inhaler     07/26/19 1216           Wyvonnia Dusky, MD 07/26/19 1745

## 2019-07-28 ENCOUNTER — Emergency Department (HOSPITAL_COMMUNITY)
Admission: EM | Admit: 2019-07-28 | Discharge: 2019-07-28 | Disposition: A | Discharge status: Home / Self Care | Payer: Medicare HMO | Attending: Emergency Medicine | Admitting: Emergency Medicine

## 2019-07-28 ENCOUNTER — Other Ambulatory Visit: Payer: Self-pay

## 2019-07-28 DIAGNOSIS — R2242 Localized swelling, mass and lump, left lower limb: Secondary | ICD-10-CM | POA: Diagnosis present

## 2019-07-28 DIAGNOSIS — Z9889 Other specified postprocedural states: Secondary | ICD-10-CM | POA: Insufficient documentation

## 2019-07-28 DIAGNOSIS — Z5321 Procedure and treatment not carried out due to patient leaving prior to being seen by health care provider: Secondary | ICD-10-CM | POA: Diagnosis not present

## 2019-07-28 NOTE — ED Notes (Addendum)
Per patient, states she had hip surgery on 10/13-states slight bruising to left lower leg/foot-slight swelling for left foot-states no CP, no SOB-states she will follow up with surgeon in am

## 2019-07-31 DIAGNOSIS — Z09 Encounter for follow-up examination after completed treatment for conditions other than malignant neoplasm: Secondary | ICD-10-CM | POA: Diagnosis not present

## 2019-07-31 DIAGNOSIS — Z720 Tobacco use: Secondary | ICD-10-CM | POA: Diagnosis not present

## 2019-07-31 DIAGNOSIS — M169 Osteoarthritis of hip, unspecified: Secondary | ICD-10-CM | POA: Diagnosis not present

## 2019-07-31 DIAGNOSIS — R0602 Shortness of breath: Secondary | ICD-10-CM | POA: Diagnosis not present

## 2019-08-01 ENCOUNTER — Encounter (HOSPITAL_COMMUNITY): Payer: Self-pay | Admitting: Orthopedic Surgery

## 2019-08-01 NOTE — Anesthesia Postprocedure Evaluation (Signed)
Anesthesia Post Note  Patient: Jacqueline Ryan  Procedure(s) Performed: TOTAL HIP ARTHROPLASTY ANTERIOR APPROACH (Left Hip)     Patient location during evaluation: PACU Anesthesia Type: MAC and Spinal Level of consciousness: awake and alert Pain management: pain level controlled Vital Signs Assessment: post-procedure vital signs reviewed and stable Respiratory status: spontaneous breathing, nonlabored ventilation, respiratory function stable and patient connected to nasal cannula oxygen Cardiovascular status: stable and blood pressure returned to baseline Postop Assessment: no apparent nausea or vomiting and spinal receding Anesthetic complications: no    Last Vitals:  Vitals:   07/24/19 0427 07/24/19 1002  BP: (!) 157/81 109/60  Pulse: 95 88  Resp: 18 14  Temp: 36.6 C 36.8 C  SpO2: 97% 96%    Last Pain:  Vitals:   07/24/19 1131  TempSrc:   PainSc: 2                  Rosana Farnell

## 2019-08-07 DIAGNOSIS — Z96642 Presence of left artificial hip joint: Secondary | ICD-10-CM | POA: Diagnosis not present

## 2019-08-07 DIAGNOSIS — Z471 Aftercare following joint replacement surgery: Secondary | ICD-10-CM | POA: Diagnosis not present

## 2019-08-10 DIAGNOSIS — D2122 Benign neoplasm of connective and other soft tissue of left lower limb, including hip: Secondary | ICD-10-CM | POA: Diagnosis not present

## 2019-08-12 DIAGNOSIS — Z471 Aftercare following joint replacement surgery: Secondary | ICD-10-CM | POA: Diagnosis not present

## 2019-08-12 DIAGNOSIS — Z96642 Presence of left artificial hip joint: Secondary | ICD-10-CM | POA: Diagnosis not present

## 2019-08-28 DIAGNOSIS — R69 Illness, unspecified: Secondary | ICD-10-CM | POA: Diagnosis not present

## 2019-09-13 DIAGNOSIS — R1011 Right upper quadrant pain: Secondary | ICD-10-CM | POA: Diagnosis not present

## 2019-09-16 DIAGNOSIS — H524 Presbyopia: Secondary | ICD-10-CM | POA: Diagnosis not present

## 2019-09-16 DIAGNOSIS — Z961 Presence of intraocular lens: Secondary | ICD-10-CM | POA: Diagnosis not present

## 2019-09-16 DIAGNOSIS — H353113 Nonexudative age-related macular degeneration, right eye, advanced atrophic without subfoveal involvement: Secondary | ICD-10-CM | POA: Diagnosis not present

## 2019-09-16 DIAGNOSIS — H353122 Nonexudative age-related macular degeneration, left eye, intermediate dry stage: Secondary | ICD-10-CM | POA: Diagnosis not present

## 2019-10-21 DIAGNOSIS — Z471 Aftercare following joint replacement surgery: Secondary | ICD-10-CM | POA: Diagnosis not present

## 2019-10-21 DIAGNOSIS — Z96642 Presence of left artificial hip joint: Secondary | ICD-10-CM | POA: Diagnosis not present

## 2019-11-19 DIAGNOSIS — R69 Illness, unspecified: Secondary | ICD-10-CM | POA: Diagnosis not present

## 2019-11-27 DIAGNOSIS — M1990 Unspecified osteoarthritis, unspecified site: Secondary | ICD-10-CM | POA: Diagnosis not present

## 2019-11-27 DIAGNOSIS — Z96649 Presence of unspecified artificial hip joint: Secondary | ICD-10-CM | POA: Diagnosis not present

## 2019-11-27 DIAGNOSIS — R69 Illness, unspecified: Secondary | ICD-10-CM | POA: Diagnosis not present

## 2019-11-27 DIAGNOSIS — Z008 Encounter for other general examination: Secondary | ICD-10-CM | POA: Diagnosis not present

## 2019-11-27 DIAGNOSIS — I10 Essential (primary) hypertension: Secondary | ICD-10-CM | POA: Diagnosis not present

## 2019-11-27 DIAGNOSIS — E663 Overweight: Secondary | ICD-10-CM | POA: Diagnosis not present

## 2019-11-27 DIAGNOSIS — I2510 Atherosclerotic heart disease of native coronary artery without angina pectoris: Secondary | ICD-10-CM | POA: Diagnosis not present

## 2019-12-03 DIAGNOSIS — R69 Illness, unspecified: Secondary | ICD-10-CM | POA: Diagnosis not present

## 2019-12-19 DIAGNOSIS — H353113 Nonexudative age-related macular degeneration, right eye, advanced atrophic without subfoveal involvement: Secondary | ICD-10-CM | POA: Diagnosis not present

## 2019-12-19 DIAGNOSIS — H353123 Nonexudative age-related macular degeneration, left eye, advanced atrophic without subfoveal involvement: Secondary | ICD-10-CM | POA: Diagnosis not present

## 2019-12-31 DIAGNOSIS — Z86010 Personal history of colonic polyps: Secondary | ICD-10-CM | POA: Diagnosis not present

## 2020-01-21 DIAGNOSIS — M9903 Segmental and somatic dysfunction of lumbar region: Secondary | ICD-10-CM | POA: Diagnosis not present

## 2020-01-21 DIAGNOSIS — M545 Low back pain: Secondary | ICD-10-CM | POA: Diagnosis not present

## 2020-01-21 DIAGNOSIS — M5136 Other intervertebral disc degeneration, lumbar region: Secondary | ICD-10-CM | POA: Diagnosis not present

## 2020-01-21 DIAGNOSIS — M62830 Muscle spasm of back: Secondary | ICD-10-CM | POA: Diagnosis not present

## 2020-02-18 DIAGNOSIS — M62830 Muscle spasm of back: Secondary | ICD-10-CM | POA: Diagnosis not present

## 2020-02-18 DIAGNOSIS — M5136 Other intervertebral disc degeneration, lumbar region: Secondary | ICD-10-CM | POA: Diagnosis not present

## 2020-02-18 DIAGNOSIS — M9903 Segmental and somatic dysfunction of lumbar region: Secondary | ICD-10-CM | POA: Diagnosis not present

## 2020-02-18 DIAGNOSIS — M545 Low back pain: Secondary | ICD-10-CM | POA: Diagnosis not present

## 2020-03-26 DIAGNOSIS — R05 Cough: Secondary | ICD-10-CM | POA: Diagnosis not present

## 2020-03-26 DIAGNOSIS — J209 Acute bronchitis, unspecified: Secondary | ICD-10-CM | POA: Diagnosis not present

## 2020-03-30 ENCOUNTER — Emergency Department (HOSPITAL_COMMUNITY)
Admission: EM | Admit: 2020-03-30 | Discharge: 2020-03-30 | Discharge status: Against Medical Advice | Payer: Medicare HMO | Attending: Emergency Medicine | Admitting: Emergency Medicine

## 2020-03-30 ENCOUNTER — Other Ambulatory Visit: Payer: Self-pay

## 2020-03-30 ENCOUNTER — Encounter (HOSPITAL_COMMUNITY): Payer: Self-pay | Admitting: *Deleted

## 2020-03-30 DIAGNOSIS — Z5321 Procedure and treatment not carried out due to patient leaving prior to being seen by health care provider: Secondary | ICD-10-CM | POA: Diagnosis not present

## 2020-03-30 DIAGNOSIS — R0902 Hypoxemia: Secondary | ICD-10-CM | POA: Diagnosis not present

## 2020-03-30 DIAGNOSIS — R0989 Other specified symptoms and signs involving the circulatory and respiratory systems: Secondary | ICD-10-CM | POA: Diagnosis not present

## 2020-03-30 DIAGNOSIS — R0602 Shortness of breath: Secondary | ICD-10-CM | POA: Diagnosis not present

## 2020-03-30 DIAGNOSIS — R69 Illness, unspecified: Secondary | ICD-10-CM | POA: Diagnosis not present

## 2020-03-30 DIAGNOSIS — R7989 Other specified abnormal findings of blood chemistry: Secondary | ICD-10-CM | POA: Diagnosis not present

## 2020-03-30 DIAGNOSIS — R079 Chest pain, unspecified: Secondary | ICD-10-CM | POA: Diagnosis not present

## 2020-03-30 DIAGNOSIS — J209 Acute bronchitis, unspecified: Secondary | ICD-10-CM | POA: Diagnosis not present

## 2020-03-30 DIAGNOSIS — Z20822 Contact with and (suspected) exposure to covid-19: Secondary | ICD-10-CM | POA: Diagnosis not present

## 2020-03-30 DIAGNOSIS — J9601 Acute respiratory failure with hypoxia: Secondary | ICD-10-CM | POA: Diagnosis not present

## 2020-03-30 NOTE — ED Triage Notes (Signed)
Pt has been treated for Bronchottis, today she went to [facility] , chest x ray was fine today. Sent here due to [place]

## 2020-03-31 DIAGNOSIS — J449 Chronic obstructive pulmonary disease, unspecified: Secondary | ICD-10-CM | POA: Diagnosis not present

## 2020-03-31 DIAGNOSIS — R0602 Shortness of breath: Secondary | ICD-10-CM | POA: Diagnosis not present

## 2020-03-31 DIAGNOSIS — J9601 Acute respiratory failure with hypoxia: Secondary | ICD-10-CM | POA: Diagnosis not present

## 2020-03-31 DIAGNOSIS — R69 Illness, unspecified: Secondary | ICD-10-CM | POA: Diagnosis not present

## 2020-03-31 DIAGNOSIS — Z20822 Contact with and (suspected) exposure to covid-19: Secondary | ICD-10-CM | POA: Diagnosis not present

## 2020-03-31 DIAGNOSIS — I10 Essential (primary) hypertension: Secondary | ICD-10-CM | POA: Diagnosis not present

## 2020-03-31 DIAGNOSIS — R079 Chest pain, unspecified: Secondary | ICD-10-CM | POA: Diagnosis not present

## 2020-03-31 DIAGNOSIS — R7989 Other specified abnormal findings of blood chemistry: Secondary | ICD-10-CM | POA: Diagnosis not present

## 2020-03-31 DIAGNOSIS — E871 Hypo-osmolality and hyponatremia: Secondary | ICD-10-CM | POA: Diagnosis not present

## 2020-03-31 DIAGNOSIS — Z79899 Other long term (current) drug therapy: Secondary | ICD-10-CM | POA: Diagnosis not present

## 2020-04-01 DIAGNOSIS — R0602 Shortness of breath: Secondary | ICD-10-CM | POA: Diagnosis not present

## 2020-04-01 DIAGNOSIS — J9601 Acute respiratory failure with hypoxia: Secondary | ICD-10-CM | POA: Diagnosis not present

## 2020-04-03 DIAGNOSIS — E871 Hypo-osmolality and hyponatremia: Secondary | ICD-10-CM | POA: Diagnosis not present

## 2020-04-03 DIAGNOSIS — E876 Hypokalemia: Secondary | ICD-10-CM | POA: Diagnosis not present

## 2020-04-03 DIAGNOSIS — Z79899 Other long term (current) drug therapy: Secondary | ICD-10-CM | POA: Diagnosis not present

## 2020-04-03 DIAGNOSIS — J209 Acute bronchitis, unspecified: Secondary | ICD-10-CM | POA: Diagnosis not present

## 2020-04-03 DIAGNOSIS — Z7952 Long term (current) use of systemic steroids: Secondary | ICD-10-CM | POA: Diagnosis not present

## 2020-04-03 DIAGNOSIS — J441 Chronic obstructive pulmonary disease with (acute) exacerbation: Secondary | ICD-10-CM | POA: Diagnosis not present

## 2020-04-03 DIAGNOSIS — Z720 Tobacco use: Secondary | ICD-10-CM | POA: Diagnosis not present

## 2020-04-03 DIAGNOSIS — J9601 Acute respiratory failure with hypoxia: Secondary | ICD-10-CM | POA: Diagnosis not present

## 2020-04-03 DIAGNOSIS — Z87891 Personal history of nicotine dependence: Secondary | ICD-10-CM | POA: Diagnosis not present

## 2020-04-03 DIAGNOSIS — J449 Chronic obstructive pulmonary disease, unspecified: Secondary | ICD-10-CM | POA: Diagnosis not present

## 2020-04-03 DIAGNOSIS — I10 Essential (primary) hypertension: Secondary | ICD-10-CM | POA: Diagnosis not present

## 2020-04-03 DIAGNOSIS — R000 Tachycardia, unspecified: Secondary | ICD-10-CM | POA: Diagnosis not present

## 2020-04-03 DIAGNOSIS — J440 Chronic obstructive pulmonary disease with acute lower respiratory infection: Secondary | ICD-10-CM | POA: Diagnosis not present

## 2020-04-03 DIAGNOSIS — R0602 Shortness of breath: Secondary | ICD-10-CM | POA: Diagnosis not present

## 2020-04-04 DIAGNOSIS — J209 Acute bronchitis, unspecified: Secondary | ICD-10-CM | POA: Diagnosis not present

## 2020-04-04 DIAGNOSIS — E871 Hypo-osmolality and hyponatremia: Secondary | ICD-10-CM | POA: Diagnosis not present

## 2020-04-04 DIAGNOSIS — J441 Chronic obstructive pulmonary disease with (acute) exacerbation: Secondary | ICD-10-CM | POA: Diagnosis not present

## 2020-04-04 DIAGNOSIS — Z720 Tobacco use: Secondary | ICD-10-CM | POA: Diagnosis not present

## 2020-04-04 DIAGNOSIS — J9601 Acute respiratory failure with hypoxia: Secondary | ICD-10-CM | POA: Diagnosis not present

## 2020-04-04 DIAGNOSIS — J440 Chronic obstructive pulmonary disease with acute lower respiratory infection: Secondary | ICD-10-CM | POA: Diagnosis not present

## 2020-04-04 DIAGNOSIS — I10 Essential (primary) hypertension: Secondary | ICD-10-CM | POA: Diagnosis not present

## 2020-04-04 DIAGNOSIS — R000 Tachycardia, unspecified: Secondary | ICD-10-CM | POA: Diagnosis not present

## 2020-04-05 DIAGNOSIS — R0602 Shortness of breath: Secondary | ICD-10-CM | POA: Diagnosis not present

## 2020-04-05 DIAGNOSIS — Z720 Tobacco use: Secondary | ICD-10-CM | POA: Diagnosis not present

## 2020-04-05 DIAGNOSIS — J9601 Acute respiratory failure with hypoxia: Secondary | ICD-10-CM | POA: Diagnosis not present

## 2020-04-05 DIAGNOSIS — J209 Acute bronchitis, unspecified: Secondary | ICD-10-CM | POA: Diagnosis not present

## 2020-04-05 DIAGNOSIS — E871 Hypo-osmolality and hyponatremia: Secondary | ICD-10-CM | POA: Diagnosis not present

## 2020-04-05 DIAGNOSIS — I10 Essential (primary) hypertension: Secondary | ICD-10-CM | POA: Diagnosis not present

## 2020-04-05 DIAGNOSIS — R000 Tachycardia, unspecified: Secondary | ICD-10-CM | POA: Diagnosis not present

## 2020-04-05 DIAGNOSIS — J440 Chronic obstructive pulmonary disease with acute lower respiratory infection: Secondary | ICD-10-CM | POA: Diagnosis not present

## 2020-04-06 DIAGNOSIS — J209 Acute bronchitis, unspecified: Secondary | ICD-10-CM | POA: Diagnosis not present

## 2020-04-06 DIAGNOSIS — J440 Chronic obstructive pulmonary disease with acute lower respiratory infection: Secondary | ICD-10-CM | POA: Diagnosis not present

## 2020-04-06 DIAGNOSIS — E871 Hypo-osmolality and hyponatremia: Secondary | ICD-10-CM | POA: Diagnosis not present

## 2020-04-06 DIAGNOSIS — J9601 Acute respiratory failure with hypoxia: Secondary | ICD-10-CM | POA: Diagnosis not present

## 2020-04-06 DIAGNOSIS — I10 Essential (primary) hypertension: Secondary | ICD-10-CM | POA: Diagnosis not present

## 2020-04-06 DIAGNOSIS — Z720 Tobacco use: Secondary | ICD-10-CM | POA: Diagnosis not present

## 2020-04-06 DIAGNOSIS — R69 Illness, unspecified: Secondary | ICD-10-CM | POA: Diagnosis not present

## 2020-04-06 DIAGNOSIS — R000 Tachycardia, unspecified: Secondary | ICD-10-CM | POA: Diagnosis not present

## 2020-04-07 DIAGNOSIS — J209 Acute bronchitis, unspecified: Secondary | ICD-10-CM | POA: Diagnosis not present

## 2020-04-07 DIAGNOSIS — E871 Hypo-osmolality and hyponatremia: Secondary | ICD-10-CM | POA: Diagnosis not present

## 2020-04-07 DIAGNOSIS — R69 Illness, unspecified: Secondary | ICD-10-CM | POA: Diagnosis not present

## 2020-04-07 DIAGNOSIS — J9601 Acute respiratory failure with hypoxia: Secondary | ICD-10-CM | POA: Diagnosis not present

## 2020-04-07 DIAGNOSIS — J440 Chronic obstructive pulmonary disease with acute lower respiratory infection: Secondary | ICD-10-CM | POA: Diagnosis not present

## 2020-04-07 DIAGNOSIS — R000 Tachycardia, unspecified: Secondary | ICD-10-CM | POA: Diagnosis not present

## 2020-04-07 DIAGNOSIS — Z720 Tobacco use: Secondary | ICD-10-CM | POA: Diagnosis not present

## 2020-04-07 DIAGNOSIS — I10 Essential (primary) hypertension: Secondary | ICD-10-CM | POA: Diagnosis not present

## 2020-04-17 DIAGNOSIS — J449 Chronic obstructive pulmonary disease, unspecified: Secondary | ICD-10-CM | POA: Diagnosis not present

## 2020-04-28 DIAGNOSIS — H353123 Nonexudative age-related macular degeneration, left eye, advanced atrophic without subfoveal involvement: Secondary | ICD-10-CM | POA: Diagnosis not present

## 2020-04-28 DIAGNOSIS — H353113 Nonexudative age-related macular degeneration, right eye, advanced atrophic without subfoveal involvement: Secondary | ICD-10-CM | POA: Diagnosis not present

## 2020-04-28 DIAGNOSIS — Z961 Presence of intraocular lens: Secondary | ICD-10-CM | POA: Diagnosis not present

## 2020-05-02 DIAGNOSIS — B370 Candidal stomatitis: Secondary | ICD-10-CM | POA: Diagnosis not present

## 2020-05-13 ENCOUNTER — Institutional Professional Consult (permissible substitution): Payer: Medicare HMO | Admitting: Pulmonary Disease

## 2020-05-14 DIAGNOSIS — R05 Cough: Secondary | ICD-10-CM | POA: Diagnosis not present

## 2020-05-14 DIAGNOSIS — R0602 Shortness of breath: Secondary | ICD-10-CM | POA: Diagnosis not present

## 2020-05-14 DIAGNOSIS — J209 Acute bronchitis, unspecified: Secondary | ICD-10-CM | POA: Diagnosis not present

## 2020-05-22 DIAGNOSIS — E871 Hypo-osmolality and hyponatremia: Secondary | ICD-10-CM | POA: Diagnosis not present

## 2020-06-25 DIAGNOSIS — M62830 Muscle spasm of back: Secondary | ICD-10-CM | POA: Diagnosis not present

## 2020-06-25 DIAGNOSIS — M5136 Other intervertebral disc degeneration, lumbar region: Secondary | ICD-10-CM | POA: Diagnosis not present

## 2020-06-25 DIAGNOSIS — M545 Low back pain: Secondary | ICD-10-CM | POA: Diagnosis not present

## 2020-06-25 DIAGNOSIS — M9903 Segmental and somatic dysfunction of lumbar region: Secondary | ICD-10-CM | POA: Diagnosis not present

## 2020-07-02 DIAGNOSIS — M62830 Muscle spasm of back: Secondary | ICD-10-CM | POA: Diagnosis not present

## 2020-07-02 DIAGNOSIS — M545 Low back pain: Secondary | ICD-10-CM | POA: Diagnosis not present

## 2020-07-02 DIAGNOSIS — M9903 Segmental and somatic dysfunction of lumbar region: Secondary | ICD-10-CM | POA: Diagnosis not present

## 2020-07-02 DIAGNOSIS — M5136 Other intervertebral disc degeneration, lumbar region: Secondary | ICD-10-CM | POA: Diagnosis not present

## 2020-07-06 DIAGNOSIS — M545 Low back pain: Secondary | ICD-10-CM | POA: Diagnosis not present

## 2020-07-09 DIAGNOSIS — M9901 Segmental and somatic dysfunction of cervical region: Secondary | ICD-10-CM | POA: Diagnosis not present

## 2020-07-09 DIAGNOSIS — M5136 Other intervertebral disc degeneration, lumbar region: Secondary | ICD-10-CM | POA: Diagnosis not present

## 2020-07-09 DIAGNOSIS — M9903 Segmental and somatic dysfunction of lumbar region: Secondary | ICD-10-CM | POA: Diagnosis not present

## 2020-07-09 DIAGNOSIS — M62830 Muscle spasm of back: Secondary | ICD-10-CM | POA: Diagnosis not present

## 2020-07-15 DIAGNOSIS — Z96642 Presence of left artificial hip joint: Secondary | ICD-10-CM | POA: Diagnosis not present

## 2020-07-15 DIAGNOSIS — M25551 Pain in right hip: Secondary | ICD-10-CM | POA: Diagnosis not present

## 2020-07-15 DIAGNOSIS — Z471 Aftercare following joint replacement surgery: Secondary | ICD-10-CM | POA: Diagnosis not present

## 2020-07-27 DIAGNOSIS — M25551 Pain in right hip: Secondary | ICD-10-CM | POA: Diagnosis not present

## 2020-08-06 DIAGNOSIS — M25551 Pain in right hip: Secondary | ICD-10-CM | POA: Diagnosis not present

## 2020-08-11 DIAGNOSIS — M25551 Pain in right hip: Secondary | ICD-10-CM | POA: Diagnosis not present

## 2020-08-13 DIAGNOSIS — M25551 Pain in right hip: Secondary | ICD-10-CM | POA: Diagnosis not present

## 2020-08-18 DIAGNOSIS — M25551 Pain in right hip: Secondary | ICD-10-CM | POA: Diagnosis not present

## 2020-08-20 DIAGNOSIS — M25551 Pain in right hip: Secondary | ICD-10-CM | POA: Diagnosis not present

## 2020-08-25 DIAGNOSIS — H353123 Nonexudative age-related macular degeneration, left eye, advanced atrophic without subfoveal involvement: Secondary | ICD-10-CM | POA: Diagnosis not present

## 2020-08-25 DIAGNOSIS — H353113 Nonexudative age-related macular degeneration, right eye, advanced atrophic without subfoveal involvement: Secondary | ICD-10-CM | POA: Diagnosis not present

## 2020-08-27 DIAGNOSIS — M25551 Pain in right hip: Secondary | ICD-10-CM | POA: Diagnosis not present

## 2020-09-14 DIAGNOSIS — M9901 Segmental and somatic dysfunction of cervical region: Secondary | ICD-10-CM | POA: Diagnosis not present

## 2020-09-14 DIAGNOSIS — M5136 Other intervertebral disc degeneration, lumbar region: Secondary | ICD-10-CM | POA: Diagnosis not present

## 2020-09-14 DIAGNOSIS — M62830 Muscle spasm of back: Secondary | ICD-10-CM | POA: Diagnosis not present

## 2020-09-14 DIAGNOSIS — M9903 Segmental and somatic dysfunction of lumbar region: Secondary | ICD-10-CM | POA: Diagnosis not present

## 2020-09-22 DIAGNOSIS — M5136 Other intervertebral disc degeneration, lumbar region: Secondary | ICD-10-CM | POA: Diagnosis not present

## 2020-09-22 DIAGNOSIS — M9901 Segmental and somatic dysfunction of cervical region: Secondary | ICD-10-CM | POA: Diagnosis not present

## 2020-09-22 DIAGNOSIS — M62830 Muscle spasm of back: Secondary | ICD-10-CM | POA: Diagnosis not present

## 2020-09-22 DIAGNOSIS — M9903 Segmental and somatic dysfunction of lumbar region: Secondary | ICD-10-CM | POA: Diagnosis not present

## 2020-10-08 DIAGNOSIS — M62830 Muscle spasm of back: Secondary | ICD-10-CM | POA: Diagnosis not present

## 2020-10-08 DIAGNOSIS — M9903 Segmental and somatic dysfunction of lumbar region: Secondary | ICD-10-CM | POA: Diagnosis not present

## 2020-10-08 DIAGNOSIS — M5136 Other intervertebral disc degeneration, lumbar region: Secondary | ICD-10-CM | POA: Diagnosis not present

## 2020-10-08 DIAGNOSIS — M9901 Segmental and somatic dysfunction of cervical region: Secondary | ICD-10-CM | POA: Diagnosis not present

## 2020-10-14 ENCOUNTER — Other Ambulatory Visit: Payer: Self-pay | Admitting: Nurse Practitioner

## 2020-10-14 DIAGNOSIS — Z1231 Encounter for screening mammogram for malignant neoplasm of breast: Secondary | ICD-10-CM

## 2020-10-14 DIAGNOSIS — I10 Essential (primary) hypertension: Secondary | ICD-10-CM | POA: Diagnosis not present

## 2020-10-14 DIAGNOSIS — Z23 Encounter for immunization: Secondary | ICD-10-CM | POA: Diagnosis not present

## 2020-10-15 DIAGNOSIS — M9901 Segmental and somatic dysfunction of cervical region: Secondary | ICD-10-CM | POA: Diagnosis not present

## 2020-10-15 DIAGNOSIS — M62830 Muscle spasm of back: Secondary | ICD-10-CM | POA: Diagnosis not present

## 2020-10-15 DIAGNOSIS — M9903 Segmental and somatic dysfunction of lumbar region: Secondary | ICD-10-CM | POA: Diagnosis not present

## 2020-10-15 DIAGNOSIS — M5136 Other intervertebral disc degeneration, lumbar region: Secondary | ICD-10-CM | POA: Diagnosis not present

## 2020-10-22 DIAGNOSIS — M9901 Segmental and somatic dysfunction of cervical region: Secondary | ICD-10-CM | POA: Diagnosis not present

## 2020-10-22 DIAGNOSIS — M9903 Segmental and somatic dysfunction of lumbar region: Secondary | ICD-10-CM | POA: Diagnosis not present

## 2020-10-22 DIAGNOSIS — M5136 Other intervertebral disc degeneration, lumbar region: Secondary | ICD-10-CM | POA: Diagnosis not present

## 2020-10-22 DIAGNOSIS — M62830 Muscle spasm of back: Secondary | ICD-10-CM | POA: Diagnosis not present

## 2020-10-25 ENCOUNTER — Emergency Department (HOSPITAL_COMMUNITY): Payer: Medicare HMO

## 2020-10-25 ENCOUNTER — Emergency Department (HOSPITAL_COMMUNITY)
Admission: EM | Admit: 2020-10-25 | Discharge: 2020-10-25 | Disposition: A | Discharge status: Home / Self Care | Payer: Medicare HMO | Attending: Emergency Medicine | Admitting: Emergency Medicine

## 2020-10-25 DIAGNOSIS — R69 Illness, unspecified: Secondary | ICD-10-CM | POA: Diagnosis not present

## 2020-10-25 DIAGNOSIS — Z20822 Contact with and (suspected) exposure to covid-19: Secondary | ICD-10-CM | POA: Diagnosis not present

## 2020-10-25 DIAGNOSIS — I10 Essential (primary) hypertension: Secondary | ICD-10-CM | POA: Insufficient documentation

## 2020-10-25 DIAGNOSIS — Z96642 Presence of left artificial hip joint: Secondary | ICD-10-CM | POA: Diagnosis not present

## 2020-10-25 DIAGNOSIS — Z79899 Other long term (current) drug therapy: Secondary | ICD-10-CM | POA: Insufficient documentation

## 2020-10-25 DIAGNOSIS — T783XXA Angioneurotic edema, initial encounter: Secondary | ICD-10-CM

## 2020-10-25 DIAGNOSIS — R000 Tachycardia, unspecified: Secondary | ICD-10-CM | POA: Diagnosis not present

## 2020-10-25 DIAGNOSIS — R0682 Tachypnea, not elsewhere classified: Secondary | ICD-10-CM | POA: Insufficient documentation

## 2020-10-25 DIAGNOSIS — K148 Other diseases of tongue: Secondary | ICD-10-CM | POA: Diagnosis present

## 2020-10-25 DIAGNOSIS — R600 Localized edema: Secondary | ICD-10-CM | POA: Insufficient documentation

## 2020-10-25 DIAGNOSIS — F17210 Nicotine dependence, cigarettes, uncomplicated: Secondary | ICD-10-CM | POA: Diagnosis not present

## 2020-10-25 DIAGNOSIS — R0602 Shortness of breath: Secondary | ICD-10-CM | POA: Diagnosis not present

## 2020-10-25 LAB — RESP PANEL BY RT-PCR (FLU A&B, COVID) ARPGX2
Influenza A by PCR: NEGATIVE
Influenza B by PCR: NEGATIVE
SARS Coronavirus 2 by RT PCR: NEGATIVE

## 2020-10-25 MED ORDER — METHYLPREDNISOLONE SODIUM SUCC 125 MG IJ SOLR
60.0000 mg | Freq: Once | INTRAMUSCULAR | Status: AC
  Administered 2020-10-25: 60 mg via INTRAVENOUS
  Filled 2020-10-25: qty 2

## 2020-10-25 MED ORDER — PREDNISONE 50 MG PO TABS: 50.0000 mg | ORAL_TABLET | Freq: Every day | ORAL | 0 refills | Status: AC

## 2020-10-25 MED ORDER — METHYLPREDNISOLONE SODIUM SUCC 125 MG IJ SOLR
INTRAMUSCULAR | Status: AC
  Filled 2020-10-25: qty 2

## 2020-10-25 MED ORDER — FAMOTIDINE 20 MG PO TABS: 20.0000 mg | ORAL_TABLET | Freq: Two times a day (BID) | ORAL | 0 refills | Status: DC

## 2020-10-25 MED ORDER — DIPHENHYDRAMINE HCL 25 MG PO CAPS
50.0000 mg | ORAL_CAPSULE | Freq: Once | ORAL | Status: AC
  Administered 2020-10-25: 50 mg via ORAL
  Filled 2020-10-25: qty 2

## 2020-10-25 MED ORDER — FAMOTIDINE IN NACL 20-0.9 MG/50ML-% IV SOLN
20.0000 mg | Freq: Once | INTRAVENOUS | Status: AC
  Administered 2020-10-25: 20 mg via INTRAVENOUS
  Filled 2020-10-25: qty 50

## 2020-10-25 MED ORDER — SODIUM CHLORIDE 0.9 % IV BOLUS
1000.0000 mL | Freq: Once | INTRAVENOUS | Status: AC
  Administered 2020-10-25: 1000 mL via INTRAVENOUS

## 2020-10-25 MED ORDER — EPINEPHRINE 0.3 MG/0.3ML IJ SOAJ
INTRAMUSCULAR | Status: AC
  Filled 2020-10-25: qty 0.3

## 2020-10-25 NOTE — Discharge Instructions (Addendum)
You were evaluated in the emergency room today for your tongue and neck swelling. You were diagnosed with angioedema, likely a reaction to your losartan.   You were given several medications in the ED, with significant improvement in your symptoms. At this time we feel you are safe to go home.  DO NOT TAKE ANY LOSARTAN, as this was likely the cause of your reaction today.  You should follow-up soon as possible with your primary care doctor to establish a new treatment plan for your blood pressure, as well as to reassess your blood pressure.  In order to prevent recurrence of today's symptoms you should take the following medications: Benadryl 25 mg every 4-6 hours x 3 days. Pepcid 20 mg twice daily x 5 days.  You have also been prescribed prednisone, which is a steroid that you should take daily x 5 days as prescribed.   Return to the emergency department if you develop any more swelling of your tongue, your neck, pain in your neck, difficulty breathing, or any other new severe symptoms.

## 2020-10-25 NOTE — ED Provider Notes (Signed)
[facility] DEPT Provider Note   CSN: [id number] Arrival date & time: 10/25/20  1115     History Chief Complaint  Patient presents with  . Angioedema    Jacqueline Ryan is a 75 y.o. female who presents with concern for acute swelling of her tongue and anterior neck that she noticed when she woke up this morning around 8 am. She states that initially the left side of her tongue was swollen, took 50 mg of Benadryl at home without relief. Currently has progressed at this time and she is experiencing sensation that her throat is closing.   Nursing staff alerted this provider as they were walking the patient passed into the room.  Patient was able to provide history, though speech was significantly affected by tongue swelling.  She states that she recently started on her HCTZ, has been on losartan 100mg  and amlodipine for quite some time though the dosages were recently adjusted.  I have personally reviewed this patient's medical records.  She has history of hypertension, hepatitis, anxiety, arthritis.  She has never had any reaction like this prior.  HPI     Past Medical History:  Diagnosis Date  . Anxiety   . Arthritis   . Hepatitis    age 66  . Hypertension     Patient Active Problem List   Diagnosis Date Noted  . S/P left THA, AA 07/23/2019  . Status post total hip replacement, left 07/23/2019    Past Surgical History:  Procedure Laterality Date  . ABDOMINAL HYSTERECTOMY    . EYE SURGERY    . TONSILLECTOMY    . TOTAL HIP ARTHROPLASTY Left 07/23/2019   Procedure: TOTAL HIP ARTHROPLASTY ANTERIOR APPROACH;  Surgeon: Paralee Cancel, MD;  Location: [facility];  Service: Orthopedics;  Laterality: Left;  70 mins     OB History   No obstetric history on file.     No family history on file.  Social History   Tobacco Use  . Smoking status: Current Some Day Smoker    Packs/day: 1.00    Years: 15.00    Pack years: 15.00    Types: Cigarettes  .  Smokeless tobacco: Never Used  Vaping Use  . Vaping Use: Never used  Substance Use Topics  . Alcohol use: Yes    Alcohol/week: 9.0 standard drinks    Types: 9 Cans of beer per week  . Drug use: Never    Home Medications Prior to Admission medications   Medication Sig Start Date End Date Taking? Authorizing Provider  famotidine (PEPCID) 20 MG tablet Take 1 tablet (20 mg total) by mouth 2 (two) times daily for 7 days. 10/25/20 11/01/20 Yes Jaslyne Beeck, Gypsy Balsam, PA-C  predniSONE (DELTASONE) 50 MG tablet Take 1 tablet (50 mg total) by mouth daily for 5 days. 10/25/20 10/30/20 Yes Sujey Gundry R, PA-C  albuterol (VENTOLIN HFA) 108 (90 Base) MCG/ACT inhaler Inhale 1-2 puffs into the lungs every 6 (six) hours as needed for wheezing or shortness of breath. 07/26/19   Wyvonnia Dusky, MD  amLODipine (NORVASC) 10 MG tablet Take 10 mg by mouth daily. 07/28/20   [provider]  amLODipine (NORVASC) 2.5 MG tablet Take 2.5 mg by mouth at bedtime.  06/21/18   [provider]  docusate sodium (COLACE) 100 MG capsule Take 1 capsule (100 mg total) by mouth 2 (two) times daily. 07/24/19   Danae Orleans, PA-C  ferrous sulfate (FERROUSUL) 325 (65 FE) MG tablet Take 1 tablet (325 mg  total) by mouth 3 (three) times daily with meals for 14 days. 07/24/19 08/07/19  Danae Orleans, PA-C  hydrochlorothiazide (HYDRODIURIL) 25 MG tablet Take 25 mg by mouth daily. 10/15/20   [provider]  HYDROcodone-acetaminophen (NORCO) 5-325 MG tablet Take 1-2 tablets by mouth every 4 (four) hours as needed for moderate pain or severe pain. 07/24/19   Danae Orleans, PA-C  LORazepam (ATIVAN) 0.5 MG tablet Take 0.5 mg by mouth every 8 (eight) hours.    [provider]  losartan (COZAAR) 100 MG tablet Take 100 mg by mouth daily. 10/22/20   [provider]  losartan-hydrochlorothiazide (HYZAAR) 100-12.5 MG tablet Take 1 tablet by mouth daily.    [provider]   methocarbamol (ROBAXIN) 500 MG tablet Take 1 tablet (500 mg total) by mouth every 6 (six) hours as needed for muscle spasms. 07/24/19   Danae Orleans, PA-C  methylPREDNISolone (MEDROL DOSEPAK) 4 MG TBPK tablet Take as directed on package 07/27/19   Wyvonnia Dusky, MD  Multiple Vitamins-Minerals (PRESERVISION/LUTEIN PO) Take 1 capsule by mouth daily.    [provider]  multivitamin-lutein (OCUVITE-LUTEIN) CAPS capsule Take 1 capsule by mouth daily.    [provider]  polyethylene glycol (MIRALAX / GLYCOLAX) 17 g packet Take 17 g by mouth 2 (two) times daily. 07/24/19   Danae Orleans, PA-C  Spacer/Aero-Holding Chambers (E-Z SPACER) inhaler Use as instructed 07/26/19   Wyvonnia Dusky, MD  Vitamin D, Ergocalciferol, (DRISDOL) 1.25 MG (50000 UT) CAPS capsule Take 50,000 Units by mouth every 7 (seven) days.    [provider]    Allergies    Patient has no known allergies.  Review of Systems   Review of Systems  Constitutional: Negative.   HENT: Negative.   Respiratory: Positive for shortness of breath.        Tongue and throat swelling  Cardiovascular: Negative.   Gastrointestinal: Negative.   Endocrine: Negative.   Genitourinary: Negative.   Musculoskeletal: Negative.   Skin: Negative.   Neurological: Positive for speech difficulty.       Secondary to tongue swelling    Physical Exam Updated Vital Signs BP (!) 164/95   Pulse (!) 122   Temp 99.4 F (37.4 C) (Oral)   Resp 19   SpO2 91%   Physical Exam Vitals and nursing note reviewed.  HENT:     Head: Normocephalic and atraumatic.     Nose: Nose normal.     Mouth/Throat:     Mouth: Mucous membranes are moist.     Pharynx: No oropharyngeal exudate or posterior oropharyngeal erythema.     Comments: Significant lingual swelling, sublingual TTP.  Uvula not visualized to to lingual edema Eyes:     General:        Right eye: No discharge.        Left eye: No discharge.     Extraocular  Movements: Extraocular movements intact.     Conjunctiva/sclera: Conjunctivae normal.     Pupils: Pupils are equal, round, and reactive to light.  Neck:   Cardiovascular:     Rate and Rhythm: Regular rhythm. Tachycardia present.     Pulses: Normal pulses.          Radial pulses are 2+ on the right side and 2+ on the left side.       Dorsalis pedis pulses are 2+ on the right side and 2+ on the left side.     Heart sounds: Normal heart sounds. No murmur heard.  Pulmonary:     Effort: Pulmonary effort is normal. Tachypnea present. No respiratory distress.     Breath sounds: Normal breath sounds. No wheezing or rales.  Chest:     Chest wall: No deformity, swelling, tenderness, crepitus or edema.  Abdominal:     General: Bowel sounds are normal. There is no distension.     Palpations: Abdomen is soft.     Tenderness: There is no abdominal tenderness. There is no guarding or rebound.  Musculoskeletal:        General: No deformity.     Cervical back: Neck supple. Edema present. No rigidity or crepitus. No pain with movement, spinous process tenderness or muscular tenderness.     Right lower leg: No edema.     Left lower leg: No edema.  Lymphadenopathy:     Cervical: No cervical adenopathy.  Skin:    General: Skin is warm and dry.     Capillary Refill: Capillary refill takes less than 2 seconds.  Neurological:     General: No focal deficit present.     Mental Status: She is alert and oriented to person, place, and time. Mental status is at baseline.     Sensory: Sensation is intact.     Motor: Motor function is intact.     Gait: Gait is intact.  Psychiatric:        Mood and Affect: Mood normal.     ED Results / Procedures / Treatments   Labs (all labs ordered are listed, but only abnormal results are displayed) Labs Reviewed  RESP PANEL BY RT-PCR (FLU A&B, COVID) [id number]   EKG EKG Interpretation  Date/Time:  Sunday October 25 2020 12:11:05 EST Ventricular Rate:  92 PR  Interval:    QRS Duration: 87 QT Interval:  366 QTC Calculation: 453 R Axis:   96 Text Interpretation: Sinus rhythm Borderline prolonged PR interval Consider left atrial enlargement Right axis deviation since last tracing no significant change Confirmed by Daleen Bo [phone]) on 10/25/2020 12:16:41 PM  Radiology DG Chest Portable 1 View  Result Date: 10/25/2020 CLINICAL DATA:  Shortness of breath. EXAM: PORTABLE CHEST 1 VIEW COMPARISON:  March 30, 2020. FINDINGS: Stable cardiomediastinal silhouette. No pneumothorax or pleural effusion is noted. Both lungs are clear. The visualized skeletal structures are unremarkable. IMPRESSION: No active disease. Electronically Signed   By: Marijo Conception M.D.   On: 10/25/2020 12:01   Procedures Procedures (including critical care time)  Medications Ordered in ED Medications  EPINEPHrine (EPI-PEN) 0.3 mg/0.3 mL injection (  Given 10/25/20 1140)  methylPREDNISolone sodium succinate (SOLU-MEDROL) 125 mg/2 mL injection (  Given 10/25/20 1140)  famotidine (PEPCID) IVPB 20 mg premix (0 mg Intravenous Stopped 10/25/20 1235)  sodium chloride 0.9 % bolus 1,000 mL (0 mLs Intravenous Stopped 10/25/20 1409)  methylPREDNISolone sodium succinate (SOLU-MEDROL) 125 mg/2 mL injection 60 mg (60 mg Intravenous Given 10/25/20 1618)  diphenhydrAMINE (BENADRYL) capsule 50 mg (50 mg Oral Given 10/25/20 1619)    ED Course  I have reviewed the triage vital signs and the nursing notes.  Pertinent labs & imaging results that were available during my care of the patient were reviewed by me and considered in my medical decision making (see chart for details).  Clinical Course as of 10/25/20 1647  Sun Oct 25, 2020  1200 Patient reevaluated, with improvement in lingual swelling, no longer tachycardic or tachypneic, speech is much more clear.  [RS]  1248 Patient continues to do well, vital signs are stable, she  endorses further relief in sensation of throat swelling, continues to  speak more clearly. Husband is at the bedside.  [RS]  5053 CXR negative for acute cardiopulmonary disease, EKG reassuring, NSR, no STEMI. Respiratory pathogen panel negative.  [RS]  9767 Continues to do well at this time, speaking more easily, denies SOB, chest pain [RS]  1443 Continues to do well, stable.  [RS]  3419 Patient passed swallow screen, tolerating PO.   [RS]  1609 Patient reevaluated with significant improvement in lingual and anterior neck swelling. Ready to go home. No increased WOB, not tachypneic. Lungs are CTAB on reasucultation. Patient is tachycardic, likely secondary to epinephrine administration.  [RS]    Clinical Course User Index [RS] Patricia Perales, Sharlene Dory   MDM Rules/Calculators/A&P                         75 year old female who presents with sudden onset lingual and anterior neck swelling that started this morning.  50 mg of Benadryl at home without improvement.  This provider was alerted by nursing staff as they emergently moved the patient to the resuscitation bay.  Patient able to speak and tell me history, however speech impaired secondary to significant lingual swelling.  Cardiopulmonary exam with tachycardia, tachypnea, oral exam revealed extensive lingual edema and sublingual tenderness to palpation, extensive submental and anterior neck swelling as well.  Concern for imminent threat to airway secondary to angioedema.  Attending at the bedside as well for initial evaluation.  Will proceed with Solu-Medrol, subcu epi, Pepcid, and fluids; no further benadryl as patient took benadryl just prior to arrival. ECG and CXR as well.  Will maintain close reevaluation of the patient, for possible necessary intubation for patient's airway.   Patient has completed a 4-hour observation. After administration of epinephrine, with significant improvement in her lingual and neck swelling, without increased work of breathing. Is able to swallow, she has passed a swallow screen.   Patient is mildly tachycardic, however I believe is secondary to administration of epinephrine, as her breathing is significantly improved.  Lungs are clear to auscultation bilaterally.  Patient is more tachypneic. She is able to speak in full sentences without difficulty and her speech is much more clear to understanding at this time.  Will administer second dose of steroid and Benadryl while in the emergency department.  Will discharge home with instructions to take Benadryl, Pepcid, and prescription for prednisone daily for the next 5 days.  Given reassuring physical exam and vital signs, no further work-up is warranted in the emergency department at this time.  Jacqueline Ryan voiced understanding of her medical evaluation and treatment plan.  Each of her questions were answered to her expressed satisfaction. Recommend she follow-up closely with her primary care doctor for recheck of her blood pressure and new plan for hypertension medication.  She should no longer take her losartan. Strict return precautions were given.  Patient is stable and appropriate for discharge at this time.  Jacqueline Ryan was evaluated in Emergency Department on 10/25/2020 for the symptoms described in the history of present illness. She was evaluated in the context of the global COVID-19 pandemic, which necessitated consideration that the patient might be at risk for infection with the SARS-CoV-2 virus that causes COVID-19. Institutional protocols and algorithms that pertain to the evaluation of patients at risk for COVID-19 are in a state of rapid change based on information released by regulatory bodies including the CDC and federal and state organizations. These  policies and algorithms were followed during the patient's care in the ED.  This chart was dictated using voice recognition software, Dragon. Despite the best efforts of this provider to proofread and correct errors, errors may still occur which can change documentation  meaning.  Final Clinical Impression(s) / ED Diagnoses Final diagnoses:  Angioedema, initial encounter    Rx / DC Orders ED Discharge Orders         Ordered    famotidine (PEPCID) 20 MG tablet  2 times daily        10/25/20 1629    predniSONE (DELTASONE) 50 MG tablet  Daily        10/25/20 [address] Gypsy Balsam, PA-C 10/25/20 1647    Daleen Bo, MD 10/27/20 1621

## 2020-10-25 NOTE — ED Triage Notes (Signed)
Patient reports to the ER for tongue swelling and SOB. Patient reports she recently started taking HCTZ this morning. Patient states she started noticing swelling this morning and it has since gotten worse and harder to breathe.

## 2020-10-29 DIAGNOSIS — M9903 Segmental and somatic dysfunction of lumbar region: Secondary | ICD-10-CM | POA: Diagnosis not present

## 2020-10-29 DIAGNOSIS — M9901 Segmental and somatic dysfunction of cervical region: Secondary | ICD-10-CM | POA: Diagnosis not present

## 2020-10-29 DIAGNOSIS — I10 Essential (primary) hypertension: Secondary | ICD-10-CM | POA: Diagnosis not present

## 2020-10-29 DIAGNOSIS — M5136 Other intervertebral disc degeneration, lumbar region: Secondary | ICD-10-CM | POA: Diagnosis not present

## 2020-10-29 DIAGNOSIS — M62830 Muscle spasm of back: Secondary | ICD-10-CM | POA: Diagnosis not present

## 2020-11-05 DIAGNOSIS — M9901 Segmental and somatic dysfunction of cervical region: Secondary | ICD-10-CM | POA: Diagnosis not present

## 2020-11-05 DIAGNOSIS — H353113 Nonexudative age-related macular degeneration, right eye, advanced atrophic without subfoveal involvement: Secondary | ICD-10-CM | POA: Diagnosis not present

## 2020-11-05 DIAGNOSIS — Z961 Presence of intraocular lens: Secondary | ICD-10-CM | POA: Diagnosis not present

## 2020-11-05 DIAGNOSIS — M9903 Segmental and somatic dysfunction of lumbar region: Secondary | ICD-10-CM | POA: Diagnosis not present

## 2020-11-05 DIAGNOSIS — M5136 Other intervertebral disc degeneration, lumbar region: Secondary | ICD-10-CM | POA: Diagnosis not present

## 2020-11-05 DIAGNOSIS — H353123 Nonexudative age-related macular degeneration, left eye, advanced atrophic without subfoveal involvement: Secondary | ICD-10-CM | POA: Diagnosis not present

## 2020-11-05 DIAGNOSIS — M62830 Muscle spasm of back: Secondary | ICD-10-CM | POA: Diagnosis not present

## 2020-11-09 DIAGNOSIS — I10 Essential (primary) hypertension: Secondary | ICD-10-CM | POA: Diagnosis not present

## 2020-11-09 DIAGNOSIS — Z87891 Personal history of nicotine dependence: Secondary | ICD-10-CM | POA: Diagnosis not present

## 2020-11-09 DIAGNOSIS — M1990 Unspecified osteoarthritis, unspecified site: Secondary | ICD-10-CM | POA: Diagnosis not present

## 2020-11-09 DIAGNOSIS — Z7982 Long term (current) use of aspirin: Secondary | ICD-10-CM | POA: Diagnosis not present

## 2020-11-09 DIAGNOSIS — Z9181 History of falling: Secondary | ICD-10-CM | POA: Diagnosis not present

## 2020-11-09 DIAGNOSIS — Z8249 Family history of ischemic heart disease and other diseases of the circulatory system: Secondary | ICD-10-CM | POA: Diagnosis not present

## 2020-11-09 DIAGNOSIS — R32 Unspecified urinary incontinence: Secondary | ICD-10-CM | POA: Diagnosis not present

## 2020-11-09 DIAGNOSIS — Z809 Family history of malignant neoplasm, unspecified: Secondary | ICD-10-CM | POA: Diagnosis not present

## 2020-11-09 DIAGNOSIS — G8929 Other chronic pain: Secondary | ICD-10-CM | POA: Diagnosis not present

## 2020-11-09 DIAGNOSIS — M545 Low back pain, unspecified: Secondary | ICD-10-CM | POA: Diagnosis not present

## 2020-11-09 DIAGNOSIS — Z008 Encounter for other general examination: Secondary | ICD-10-CM | POA: Diagnosis not present

## 2020-11-18 DIAGNOSIS — I10 Essential (primary) hypertension: Secondary | ICD-10-CM | POA: Diagnosis not present

## 2020-11-18 DIAGNOSIS — M8580 Other specified disorders of bone density and structure, unspecified site: Secondary | ICD-10-CM | POA: Diagnosis not present

## 2020-11-18 DIAGNOSIS — D582 Other hemoglobinopathies: Secondary | ICD-10-CM | POA: Diagnosis not present

## 2020-11-18 DIAGNOSIS — M19011 Primary osteoarthritis, right shoulder: Secondary | ICD-10-CM | POA: Diagnosis not present

## 2020-11-18 DIAGNOSIS — M160 Bilateral primary osteoarthritis of hip: Secondary | ICD-10-CM | POA: Diagnosis not present

## 2020-11-18 DIAGNOSIS — M19012 Primary osteoarthritis, left shoulder: Secondary | ICD-10-CM | POA: Diagnosis not present

## 2020-11-18 DIAGNOSIS — J449 Chronic obstructive pulmonary disease, unspecified: Secondary | ICD-10-CM | POA: Diagnosis not present

## 2020-11-19 DIAGNOSIS — M62830 Muscle spasm of back: Secondary | ICD-10-CM | POA: Diagnosis not present

## 2020-11-19 DIAGNOSIS — M5136 Other intervertebral disc degeneration, lumbar region: Secondary | ICD-10-CM | POA: Diagnosis not present

## 2020-11-19 DIAGNOSIS — M9901 Segmental and somatic dysfunction of cervical region: Secondary | ICD-10-CM | POA: Diagnosis not present

## 2020-11-19 DIAGNOSIS — M9903 Segmental and somatic dysfunction of lumbar region: Secondary | ICD-10-CM | POA: Diagnosis not present

## 2020-11-26 DIAGNOSIS — M9901 Segmental and somatic dysfunction of cervical region: Secondary | ICD-10-CM | POA: Diagnosis not present

## 2020-11-26 DIAGNOSIS — M5136 Other intervertebral disc degeneration, lumbar region: Secondary | ICD-10-CM | POA: Diagnosis not present

## 2020-11-26 DIAGNOSIS — M62830 Muscle spasm of back: Secondary | ICD-10-CM | POA: Diagnosis not present

## 2020-11-26 DIAGNOSIS — M9903 Segmental and somatic dysfunction of lumbar region: Secondary | ICD-10-CM | POA: Diagnosis not present

## 2020-11-27 ENCOUNTER — Other Ambulatory Visit (HOSPITAL_BASED_OUTPATIENT_CLINIC_OR_DEPARTMENT_OTHER): Payer: Self-pay | Admitting: Nurse Practitioner

## 2020-11-27 DIAGNOSIS — Z13820 Encounter for screening for osteoporosis: Secondary | ICD-10-CM

## 2020-12-03 DIAGNOSIS — M19012 Primary osteoarthritis, left shoulder: Secondary | ICD-10-CM | POA: Diagnosis not present

## 2020-12-03 DIAGNOSIS — M25511 Pain in right shoulder: Secondary | ICD-10-CM | POA: Diagnosis not present

## 2020-12-03 DIAGNOSIS — M25512 Pain in left shoulder: Secondary | ICD-10-CM | POA: Diagnosis not present

## 2020-12-08 ENCOUNTER — Other Ambulatory Visit: Payer: Self-pay

## 2020-12-08 ENCOUNTER — Encounter (HOSPITAL_BASED_OUTPATIENT_CLINIC_OR_DEPARTMENT_OTHER): Payer: Self-pay

## 2020-12-08 ENCOUNTER — Ambulatory Visit (HOSPITAL_BASED_OUTPATIENT_CLINIC_OR_DEPARTMENT_OTHER)
Admission: RE | Admit: 2020-12-08 | Discharge: 2020-12-08 | Disposition: A | Discharge status: Home / Self Care | Payer: Medicare HMO | Source: Ambulatory Visit | Attending: Nurse Practitioner | Admitting: Nurse Practitioner

## 2020-12-08 DIAGNOSIS — Z1231 Encounter for screening mammogram for malignant neoplasm of breast: Secondary | ICD-10-CM | POA: Insufficient documentation

## 2020-12-08 DIAGNOSIS — M85832 Other specified disorders of bone density and structure, left forearm: Secondary | ICD-10-CM | POA: Diagnosis not present

## 2020-12-08 DIAGNOSIS — F17200 Nicotine dependence, unspecified, uncomplicated: Secondary | ICD-10-CM | POA: Diagnosis not present

## 2020-12-08 DIAGNOSIS — M8589 Other specified disorders of bone density and structure, multiple sites: Secondary | ICD-10-CM | POA: Insufficient documentation

## 2020-12-08 DIAGNOSIS — Z780 Asymptomatic menopausal state: Secondary | ICD-10-CM | POA: Insufficient documentation

## 2020-12-08 DIAGNOSIS — Z13820 Encounter for screening for osteoporosis: Secondary | ICD-10-CM | POA: Insufficient documentation

## 2020-12-08 DIAGNOSIS — R69 Illness, unspecified: Secondary | ICD-10-CM | POA: Diagnosis not present

## 2020-12-08 DIAGNOSIS — M85851 Other specified disorders of bone density and structure, right thigh: Secondary | ICD-10-CM | POA: Diagnosis not present

## 2020-12-14 DIAGNOSIS — R69 Illness, unspecified: Secondary | ICD-10-CM | POA: Diagnosis not present

## 2020-12-14 DIAGNOSIS — I700 Atherosclerosis of aorta: Secondary | ICD-10-CM | POA: Diagnosis not present

## 2020-12-14 DIAGNOSIS — J449 Chronic obstructive pulmonary disease, unspecified: Secondary | ICD-10-CM | POA: Diagnosis not present

## 2020-12-14 DIAGNOSIS — Z0000 Encounter for general adult medical examination without abnormal findings: Secondary | ICD-10-CM | POA: Diagnosis not present

## 2020-12-14 DIAGNOSIS — B0229 Other postherpetic nervous system involvement: Secondary | ICD-10-CM | POA: Diagnosis not present

## 2020-12-14 DIAGNOSIS — M19012 Primary osteoarthritis, left shoulder: Secondary | ICD-10-CM | POA: Diagnosis not present

## 2020-12-14 DIAGNOSIS — E559 Vitamin D deficiency, unspecified: Secondary | ICD-10-CM | POA: Diagnosis not present

## 2020-12-14 DIAGNOSIS — I10 Essential (primary) hypertension: Secondary | ICD-10-CM | POA: Diagnosis not present

## 2020-12-14 DIAGNOSIS — M19011 Primary osteoarthritis, right shoulder: Secondary | ICD-10-CM | POA: Diagnosis not present

## 2020-12-14 DIAGNOSIS — Z136 Encounter for screening for cardiovascular disorders: Secondary | ICD-10-CM | POA: Diagnosis not present

## 2020-12-14 DIAGNOSIS — M8580 Other specified disorders of bone density and structure, unspecified site: Secondary | ICD-10-CM | POA: Diagnosis not present

## 2020-12-17 DIAGNOSIS — M9903 Segmental and somatic dysfunction of lumbar region: Secondary | ICD-10-CM | POA: Diagnosis not present

## 2020-12-17 DIAGNOSIS — M9901 Segmental and somatic dysfunction of cervical region: Secondary | ICD-10-CM | POA: Diagnosis not present

## 2020-12-17 DIAGNOSIS — M62830 Muscle spasm of back: Secondary | ICD-10-CM | POA: Diagnosis not present

## 2020-12-17 DIAGNOSIS — M5136 Other intervertebral disc degeneration, lumbar region: Secondary | ICD-10-CM | POA: Diagnosis not present

## 2021-01-06 DIAGNOSIS — M79641 Pain in right hand: Secondary | ICD-10-CM | POA: Diagnosis not present

## 2021-01-06 DIAGNOSIS — M79642 Pain in left hand: Secondary | ICD-10-CM | POA: Diagnosis not present

## 2021-01-14 DIAGNOSIS — M62830 Muscle spasm of back: Secondary | ICD-10-CM | POA: Diagnosis not present

## 2021-01-14 DIAGNOSIS — M5136 Other intervertebral disc degeneration, lumbar region: Secondary | ICD-10-CM | POA: Diagnosis not present

## 2021-01-14 DIAGNOSIS — M9901 Segmental and somatic dysfunction of cervical region: Secondary | ICD-10-CM | POA: Diagnosis not present

## 2021-01-14 DIAGNOSIS — M9903 Segmental and somatic dysfunction of lumbar region: Secondary | ICD-10-CM | POA: Diagnosis not present

## 2021-01-15 ENCOUNTER — Emergency Department (HOSPITAL_COMMUNITY)
Admission: EM | Admit: 2021-01-15 | Discharge: 2021-01-15 | Disposition: A | Discharge status: Home / Self Care | Payer: Medicare HMO | Attending: Emergency Medicine | Admitting: Emergency Medicine

## 2021-01-15 ENCOUNTER — Other Ambulatory Visit: Payer: Self-pay

## 2021-01-15 ENCOUNTER — Encounter (HOSPITAL_COMMUNITY): Payer: Self-pay

## 2021-01-15 DIAGNOSIS — R220 Localized swelling, mass and lump, head: Secondary | ICD-10-CM | POA: Insufficient documentation

## 2021-01-15 DIAGNOSIS — T7840XA Allergy, unspecified, initial encounter: Secondary | ICD-10-CM

## 2021-01-15 DIAGNOSIS — Z96642 Presence of left artificial hip joint: Secondary | ICD-10-CM | POA: Diagnosis not present

## 2021-01-15 DIAGNOSIS — Z79899 Other long term (current) drug therapy: Secondary | ICD-10-CM | POA: Insufficient documentation

## 2021-01-15 DIAGNOSIS — I10 Essential (primary) hypertension: Secondary | ICD-10-CM | POA: Insufficient documentation

## 2021-01-15 DIAGNOSIS — Z7982 Long term (current) use of aspirin: Secondary | ICD-10-CM | POA: Insufficient documentation

## 2021-01-15 DIAGNOSIS — R69 Illness, unspecified: Secondary | ICD-10-CM | POA: Diagnosis not present

## 2021-01-15 DIAGNOSIS — F17210 Nicotine dependence, cigarettes, uncomplicated: Secondary | ICD-10-CM | POA: Diagnosis not present

## 2021-01-15 MED ORDER — AMOXICILLIN-POT CLAVULANATE 875-125 MG PO TABS
1.0000 | ORAL_TABLET | Freq: Once | ORAL | Status: AC
  Administered 2021-01-15: 1 via ORAL
  Filled 2021-01-15: qty 1

## 2021-01-15 MED ORDER — METHYLPREDNISOLONE SODIUM SUCC 125 MG IJ SOLR
125.0000 mg | Freq: Once | INTRAMUSCULAR | Status: AC
  Administered 2021-01-15: 125 mg via INTRAVENOUS
  Filled 2021-01-15: qty 2

## 2021-01-15 MED ORDER — FAMOTIDINE IN NACL 20-0.9 MG/50ML-% IV SOLN
20.0000 mg | Freq: Once | INTRAVENOUS | Status: AC
  Administered 2021-01-15: 20 mg via INTRAVENOUS
  Filled 2021-01-15: qty 50

## 2021-01-15 MED ORDER — AMOXICILLIN-POT CLAVULANATE 875-125 MG PO TABS: 1.0000 | ORAL_TABLET | Freq: Two times a day (BID) | ORAL | 0 refills | Status: DC

## 2021-01-15 MED ORDER — DIPHENHYDRAMINE HCL 50 MG/ML IJ SOLN
25.0000 mg | Freq: Once | INTRAMUSCULAR | Status: AC
  Administered 2021-01-15: 25 mg via INTRAVENOUS
  Filled 2021-01-15: qty 1

## 2021-01-15 NOTE — ED Provider Notes (Signed)
[facility] DEPT Provider Note   CSN: [id number] Arrival date & time: 01/15/21  1834     History Chief Complaint  Patient presents with  . Facial Swelling    Jacqueline Ryan is a 75 y.o. female with a past medical history of hypertension, anxiety presenting to the ED with possible allergic reaction.  States that about 2 hours ago started having right-sided facial swelling including her lips.  She was sitting outside with her husband and drinking a beer after taking a shower when she started noticing swelling.  She denies history of similar symptoms in the past.  States that she has drank this beer in the past without any issues.  No new environmental exposures that she is aware of.  States that she had an episode of lip and tongue swelling several weeks ago which was found to be due to losartan.  She has since stopped taking this medication.  She denies any trouble breathing, trouble swallowing, tongue swelling, dental pain, neck stiffness.  HPI     Past Medical History:  Diagnosis Date  . Anxiety   . Arthritis   . Hepatitis    age 22  . Hypertension     Patient Active Problem List   Diagnosis Date Noted  . S/P left THA, AA 07/23/2019  . Status post total hip replacement, left 07/23/2019    Past Surgical History:  Procedure Laterality Date  . ABDOMINAL HYSTERECTOMY    . EYE SURGERY    . TONSILLECTOMY    . TOTAL HIP ARTHROPLASTY Left 07/23/2019   Procedure: TOTAL HIP ARTHROPLASTY ANTERIOR APPROACH;  Surgeon: Paralee Cancel, MD;  Location: [facility];  Service: Orthopedics;  Laterality: Left;  70 mins     OB History   No obstetric history on file.     History reviewed. No pertinent family history.  Social History   Tobacco Use  . Smoking status: Current Some Day Smoker    Packs/day: 1.00    Years: 15.00    Pack years: 15.00    Types: Cigarettes  . Smokeless tobacco: Never Used  Vaping Use  . Vaping Use: Never used  Substance Use Topics   . Alcohol use: Yes    Alcohol/week: 9.0 standard drinks    Types: 9 Cans of beer per week  . Drug use: Never    Home Medications Prior to Admission medications   Medication Sig Start Date End Date Taking? Authorizing Provider  amoxicillin-clavulanate (AUGMENTIN) 875-125 MG tablet Take 1 tablet by mouth every 12 (twelve) hours. 01/15/21  Yes Regnald Bowens, PA-C  albuterol (VENTOLIN HFA) 108 (90 Base) MCG/ACT inhaler Inhale 1-2 puffs into the lungs every 6 (six) hours as needed for wheezing or shortness of breath. Patient not taking: No sig reported 07/26/19   Wyvonnia Dusky, MD  amLODipine (NORVASC) 2.5 MG tablet Take 2.5 mg by mouth at bedtime.  06/21/18   [provider]  aspirin 325 MG tablet Take 325 mg by mouth every 6 (six) hours as needed for moderate pain.    [provider]  cholecalciferol (VITAMIN D3) 25 MCG (1000 UNIT) tablet Take 1,000 Units by mouth daily.    [provider]  docusate sodium (COLACE) 100 MG capsule Take 1 capsule (100 mg total) by mouth 2 (two) times daily. Patient not taking: No sig reported 07/24/19   Danae Orleans, PA-C  famotidine (PEPCID) 20 MG tablet Take 1 tablet (20 mg total) by mouth 2 (two) times daily for 7 days. Patient  not taking: Reported on 01/15/2021 10/25/20 11/01/20  Sponseller, Eugene Garnet R, PA-C  ferrous sulfate (FERROUSUL) 325 (65 FE) MG tablet Take 1 tablet (325 mg total) by mouth 3 (three) times daily with meals for 14 days. Patient not taking: Reported on 01/15/2021 07/24/19 08/07/19  Danae Orleans, PA-C  hydrochlorothiazide (HYDRODIURIL) 25 MG tablet Take 25 mg by mouth in the morning. 10/15/20   [provider]  HYDROcodone-acetaminophen (NORCO) 5-325 MG tablet Take 1-2 tablets by mouth every 4 (four) hours as needed for moderate pain or severe pain. Patient not taking: No sig reported 07/24/19   Danae Orleans, PA-C  LORazepam (ATIVAN) 0.5 MG tablet Take 0.5 mg by mouth daily as needed for anxiety.     [provider]  methocarbamol (ROBAXIN) 500 MG tablet Take 1 tablet (500 mg total) by mouth every 6 (six) hours as needed for muscle spasms. Patient not taking: No sig reported 07/24/19   Danae Orleans, PA-C  methylPREDNISolone (MEDROL DOSEPAK) 4 MG TBPK tablet Take as directed on package Patient not taking: No sig reported 07/27/19   Wyvonnia Dusky, MD  multivitamin-lutein [facility]) CAPS capsule Take 1 capsule by mouth daily.    [provider]  Omega-3 Fatty Acids (FISH OIL) 1000 MG CAPS Take 2,000 mg by mouth daily.    [provider]  polyethylene glycol (MIRALAX / GLYCOLAX) 17 g packet Take 17 g by mouth 2 (two) times daily. Patient not taking: No sig reported 07/24/19   Danae Orleans, PA-C  Spacer/Aero-Holding Chambers (E-Z SPACER) inhaler Use as instructed 07/26/19   Wyvonnia Dusky, MD  vitamin B-12 (CYANOCOBALAMIN) 1000 MCG tablet Take 1,000 mcg by mouth daily.    [provider]  vitamin C (ASCORBIC ACID) 500 MG tablet Take 500 mg by mouth daily.    [provider]    Allergies    Losartan potassium  Review of Systems   Review of Systems  Constitutional: Negative for appetite change, chills and fever.  HENT: Positive for facial swelling. Negative for ear pain, rhinorrhea, sneezing and sore throat.   Eyes: Negative for photophobia and visual disturbance.  Respiratory: Negative for cough, chest tightness, shortness of breath and wheezing.   Cardiovascular: Negative for chest pain and palpitations.  Gastrointestinal: Negative for abdominal pain, blood in stool, constipation, diarrhea, nausea and vomiting.  Genitourinary: Negative for dysuria, hematuria and urgency.  Musculoskeletal: Negative for myalgias.  Skin: Negative for rash.  Neurological: Negative for dizziness, weakness and light-headedness.    Physical Exam Updated Vital Signs BP (!) 161/96   Pulse 86   Temp 97.8 F (36.6 C) (Oral)   Resp 18   SpO2 96%    Physical Exam Vitals and nursing note reviewed.  Constitutional:      General: She is not in acute distress.    Appearance: She is well-developed.     Comments: No signs of respiratory distress.  Speaking complete sentences without difficulty.  HENT:     Head: Normocephalic and atraumatic.     Nose: Nose normal.     Mouth/Throat:     Comments: Angioedema noted of the right side of the lower lip.  There is right-sided jaw swelling as well. Eyes:     General: No scleral icterus.       Left eye: No discharge.     Conjunctiva/sclera: Conjunctivae normal.  Cardiovascular:     Rate and Rhythm: Normal rate and regular rhythm.     Heart sounds: Normal heart sounds. No murmur heard. No  friction rub. No gallop.   Pulmonary:     Effort: Pulmonary effort is normal. No respiratory distress.     Breath sounds: Normal breath sounds.  Abdominal:     General: Bowel sounds are normal. There is no distension.     Palpations: Abdomen is soft.     Tenderness: There is no abdominal tenderness. There is no guarding.  Musculoskeletal:        General: Normal range of motion.     Cervical back: Normal range of motion and neck supple.  Skin:    General: Skin is warm and dry.     Findings: No rash.  Neurological:     Mental Status: She is alert.     Motor: No abnormal muscle tone.     Coordination: Coordination normal.     ED Results / Procedures / Treatments   Labs (all labs ordered are listed, but only abnormal results are displayed) Labs Reviewed - No data to display  EKG None  Radiology No results found.  Procedures Procedures   Medications Ordered in ED Medications  diphenhydrAMINE (BENADRYL) injection 25 mg (25 mg Intravenous Given 01/15/21 1928)  methylPREDNISolone sodium succinate (SOLU-MEDROL) 125 mg/2 mL injection 125 mg (125 mg Intravenous Given 01/15/21 1928)  famotidine (PEPCID) IVPB 20 mg premix (0 mg Intravenous Stopped 01/15/21 2022)    ED Course  I have reviewed the  triage vital signs and the nursing notes.  Pertinent labs & imaging results that were available during my care of the patient were reviewed by me and considered in my medical decision making (see chart for details).    MDM Rules/Calculators/A&P                          75 year old female presenting to the ED for possible allergic reaction.  Reports swelling to the right side of her face and right lower lip.  She states that this has progressed for the past 2 hours.  She was outside, drinking a beer when the symptoms began.  No new environmental exposures or food intake.  States that she was found to be allergic to losartan several weeks ago and had significant tongue and lip swelling at the time.  She has been since off of that medication.  Physical exam findings notable for swelling of the right lower lip area.  There is jaw swelling noted as well.  She denies any dental pain.  She is hypertensive here.  Will start on Solu-Medrol, antihistamines and reassess.  7:38 PM Patient reports improvement in swelling.  Continuing to deny any airway involvement.  8:54 PM Patient remains in no acute distress.  Suspect this could be due to possible dental infection so we will treat with antibiotics.  She continues to not have any airway involvement. Return precautions given.   Patient is hemodynamically stable, in NAD, and able to ambulate in the ED. Evaluation does not show pathology that would require ongoing emergent intervention or inpatient treatment. I explained the diagnosis to the patient. Pain has been managed and has no complaints prior to discharge. Patient is comfortable with above plan and is stable for discharge at this time. All questions were answered prior to disposition. Strict return precautions for returning to the ED were discussed. Encouraged follow up with PCP.   An After Visit Summary was printed and given to the patient.   Portions of this note were generated with Theatre manager. Dictation errors may occur despite  best attempts at proofreading.  Final Clinical Impression(s) / ED Diagnoses Final diagnoses:  Allergic reaction, initial encounter    Rx / DC Orders ED Discharge Orders         Ordered    amoxicillin-clavulanate (AUGMENTIN) 875-125 MG tablet  Every 12 hours        01/15/21 2055           Delia Heady, PA-C 01/15/21 2056    Carmin Muskrat, MD 01/15/21 2348

## 2021-01-15 NOTE — Discharge Instructions (Addendum)
Take antibiotics to help with possible infection. Continue antihistamines as well. Return to the ER if you start to experience worsening swelling, trouble breathing, trouble swallowing or neck stiffness.

## 2021-01-15 NOTE — ED Triage Notes (Signed)
Pt reports right sided facial swelling that began around 5p. Right side of lip and cheeck are visibly swollen. Pt is unsure if she is having an allergic reaction. Pt denies tongue and throat swelling.

## 2021-01-18 ENCOUNTER — Other Ambulatory Visit: Payer: Self-pay

## 2021-01-18 ENCOUNTER — Encounter (HOSPITAL_COMMUNITY): Payer: Self-pay | Admitting: Emergency Medicine

## 2021-01-18 ENCOUNTER — Emergency Department (HOSPITAL_COMMUNITY)
Admission: EM | Admit: 2021-01-18 | Discharge: 2021-01-19 | Disposition: A | Discharge status: Home / Self Care | Payer: Medicare HMO | Attending: Emergency Medicine | Admitting: Emergency Medicine

## 2021-01-18 DIAGNOSIS — I10 Essential (primary) hypertension: Secondary | ICD-10-CM | POA: Insufficient documentation

## 2021-01-18 DIAGNOSIS — F17210 Nicotine dependence, cigarettes, uncomplicated: Secondary | ICD-10-CM | POA: Diagnosis not present

## 2021-01-18 DIAGNOSIS — Z7982 Long term (current) use of aspirin: Secondary | ICD-10-CM | POA: Insufficient documentation

## 2021-01-18 DIAGNOSIS — Z96642 Presence of left artificial hip joint: Secondary | ICD-10-CM | POA: Diagnosis not present

## 2021-01-18 DIAGNOSIS — Z79899 Other long term (current) drug therapy: Secondary | ICD-10-CM | POA: Insufficient documentation

## 2021-01-18 DIAGNOSIS — K08409 Partial loss of teeth, unspecified cause, unspecified class: Secondary | ICD-10-CM | POA: Insufficient documentation

## 2021-01-18 DIAGNOSIS — R69 Illness, unspecified: Secondary | ICD-10-CM | POA: Diagnosis not present

## 2021-01-18 DIAGNOSIS — K1379 Other lesions of oral mucosa: Secondary | ICD-10-CM | POA: Diagnosis not present

## 2021-01-18 NOTE — ED Triage Notes (Signed)
Patient had a tooth removed on the right side of her mouth today and it is bleeding. Patient states she can not get it to stop bleeding.

## 2021-01-19 NOTE — ED Provider Notes (Signed)
MSE was initiated and I personally evaluated the patient and placed orders (if any) at  12:13 AM on January 19, 2021.  Dental extraction this morning, now bleeding. On augmentin. Now with tongue swelling.  Today's Vitals   01/18/21 2337 01/18/21 2340 01/18/21 2341  BP: (!) 198/99    Pulse: (!) 104    Resp: 18    Temp: 98.2 F (36.8 C)    TempSrc: Oral    SpO2: 94%    Weight:   77.1 kg  Height:   5\' 4"  (1.626 m)  PainSc:  3     Body mass index is 29.18 kg/m.  Right side tongue swelling Bleeding upper rear molar site of extraction Not anticoagulated  The patient appears stable so that the remainder of the MSE may be completed by another provider.   Charlann Lange, PA-C 01/19/21 0016    Molpus, Jenny Reichmann, MD 01/19/21 [phone]

## 2021-01-19 NOTE — ED Provider Notes (Signed)
[facility] DEPT Provider Note: Georgena Spurling, MD, FACEP  CSN: [id number] MRN: [id number] ARRIVAL: 01/18/21 at 2330 ROOM: WA07/WA07   CHIEF COMPLAINT  Post-Extraction Bleeding   HISTORY OF PRESENT ILLNESS  01/19/21 12:24 AM Jacqueline Ryan is a 75 y.o. female who had her right upper first molar extracted yesterday for infection.  She had been on Augmentin for the previous 3 days.  Yesterday evening the extraction socket began bleeding as she was unable to get it to stop with gauze applied with pressure.  Bleeding is mild to moderate.  She is having minimal pain at the site (3 out of 10, aching).   Past Medical History:  Diagnosis Date  . Anxiety   . Arthritis   . Hepatitis    age 8  . Hypertension     Past Surgical History:  Procedure Laterality Date  . ABDOMINAL HYSTERECTOMY    . EYE SURGERY    . TONSILLECTOMY    . TOTAL HIP ARTHROPLASTY Left 07/23/2019   Procedure: TOTAL HIP ARTHROPLASTY ANTERIOR APPROACH;  Surgeon: Paralee Cancel, MD;  Location: [facility];  Service: Orthopedics;  Laterality: Left;  70 mins    History reviewed. No pertinent family history.  Social History   Tobacco Use  . Smoking status: Current Some Day Smoker    Packs/day: 1.00    Years: 15.00    Pack years: 15.00    Types: Cigarettes  . Smokeless tobacco: Never Used  Vaping Use  . Vaping Use: Never used  Substance Use Topics  . Alcohol use: Yes    Alcohol/week: 9.0 standard drinks    Types: 9 Cans of beer per week  . Drug use: Never    Prior to Admission medications   Medication Sig Start Date End Date Taking? Authorizing Provider  amLODipine (NORVASC) 2.5 MG tablet Take 2.5 mg by mouth at bedtime.  06/21/18   [provider]  amoxicillin-clavulanate (AUGMENTIN) 875-125 MG tablet Take 1 tablet by mouth every 12 (twelve) hours. 01/15/21   Khatri, Hina, PA-C  aspirin 325 MG tablet Take 325 mg by mouth every 6 (six) hours as needed for moderate pain.    [provider]   cholecalciferol (VITAMIN D3) 25 MCG (1000 UNIT) tablet Take 1,000 Units by mouth daily.    [provider]  hydrochlorothiazide (HYDRODIURIL) 25 MG tablet Take 25 mg by mouth in the morning. 10/15/20   [provider]  LORazepam (ATIVAN) 0.5 MG tablet Take 0.5 mg by mouth daily as needed for anxiety.    [provider]  multivitamin-lutein (OCUVITE-LUTEIN) CAPS capsule Take 1 capsule by mouth daily.    [provider]  Omega-3 Fatty Acids (FISH OIL) 1000 MG CAPS Take 2,000 mg by mouth daily.    [provider]  Spacer/Aero-Holding Chambers (E-Z SPACER) inhaler Use as instructed 07/26/19   Wyvonnia Dusky, MD  vitamin B-12 (CYANOCOBALAMIN) 1000 MCG tablet Take 1,000 mcg by mouth daily.    [provider]  vitamin C (ASCORBIC ACID) 500 MG tablet Take 500 mg by mouth daily.    [provider]  albuterol (VENTOLIN HFA) 108 (90 Base) MCG/ACT inhaler Inhale 1-2 puffs into the lungs every 6 (six) hours as needed for wheezing or shortness of breath. Patient not taking: No sig reported 07/26/19 01/19/21  Wyvonnia Dusky, MD  famotidine (PEPCID) 20 MG tablet Take 1 tablet (20 mg total) by mouth 2 (two) times daily for 7 days. Patient not taking: Reported on 01/15/2021 10/25/20 01/19/21  Sponseller,  Rebekah R, PA-C  ferrous sulfate (FERROUSUL) 325 (65 FE) MG tablet Take 1 tablet (325 mg total) by mouth 3 (three) times daily with meals for 14 days. Patient not taking: Reported on 01/15/2021 07/24/19 01/19/21  Danae Orleans, PA-C    Allergies Losartan potassium   REVIEW OF SYSTEMS  Negative except as noted here or in the History of Present Illness.   PHYSICAL EXAMINATION  Initial Vital Signs Blood pressure (!) 198/99, pulse (!) 104, temperature 98.2 F (36.8 C), temperature source Oral, resp. rate 18, height 5\' 4"  (1.626 m), weight 77.1 kg, SpO2 94 %.  Examination General: Well-developed, well-nourished female in no acute distress;  appearance consistent with age of record HENT: normocephalic; fresh right upper first molar extraction socket with oozing of blood Eyes: pupils equal, round and reactive to light; extraocular muscles intact Neck: supple Heart: regular rate and rhythm Lungs: clear to auscultation bilaterally Abdomen: soft; nondistended; nontender; bowel sounds present Extremities: No deformity; full range of motion Neurologic: Awake, alert and oriented; motor function intact in all extremities and symmetric; no facial droop Skin: Warm and dry Psychiatric: Normal mood and affect   RESULTS  Summary of this visit's results, reviewed and interpreted by myself:   EKG Interpretation  Date/Time:    Ventricular Rate:    PR Interval:    QRS Duration:   QT Interval:    QTC Calculation:   R Axis:     Text Interpretation:        Laboratory Studies: No results found for this or any previous visit (from the past 24 hour(s)). Imaging Studies: No results found.  ED COURSE and MDM  Nursing notes, initial and subsequent vitals signs, including pulse oximetry, reviewed and interpreted by myself.  Vitals:   01/18/21 2337 01/18/21 2341  BP: (!) 198/99   Pulse: (!) 104   Resp: 18   Temp: 98.2 F (36.8 C)   TempSrc: Oral   SpO2: 94%   Weight:  77.1 kg  Height:  5\' 4"  (1.626 m)   Medications - No data to display  Patient given moistened Lipton tea bag and instructed to bite down on it and keep pressure on the extraction socket.  2:06 AM Extraction socket hemostatic.  Patient given extra Lipton tea bags for use at home should bleeding recur.  PROCEDURES  Procedures   ED DIAGNOSES     ICD-10-CM   1. Bleeding from mouth  K13.79   2. S/P tooth extraction  K08.409        Anabeth Chilcott, Jenny Reichmann, MD 01/19/21 [phone]

## 2021-01-19 NOTE — ED Notes (Signed)
Pt right tooth where extraction occurred has stopped bleeding

## 2021-01-21 NOTE — Progress Notes (Signed)
Sent message, via epic in basket, requesting orders in epic from surgeon.  

## 2021-01-25 NOTE — H&P (Signed)
Patient's anticipated LOS is less than 2 midnights, meeting these requirements: - Younger than 89 - Lives within 1 hour of care - Has a competent adult at home to recover with post-op recover - NO history of  - Chronic pain requiring opiods  - Diabetes  - Coronary Artery Disease  - Heart failure  - Heart attack  - Stroke  - DVT/VTE  - Cardiac arrhythmia  - Respiratory Failure/COPD  - Renal failure  - Anemia  - Advanced Liver disease       Jacqueline Ryan is an 75 y.o. female.    Chief Complaint: left shoulder pain  HPI: Pt is a 75 y.o. female complaining of left shoulder pain for multiple years. Pain had continually increased since the beginning. X-rays in the clinic show end-stage arthritic changes of the left shoulder. Pt has tried various conservative treatments which have failed to alleviate their symptoms, including injections and therapy. Various options are discussed with the patient. Risks, benefits and expectations were discussed with the patient. Patient understand the risks, benefits and expectations and wishes to proceed with surgery.   PCP:  Pcp, No  D/C Plans: Home  PMH: Past Medical History:  Diagnosis Date  . Anxiety   . Arthritis   . Hepatitis    age 42  . Hypertension     PSH: Past Surgical History:  Procedure Laterality Date  . ABDOMINAL HYSTERECTOMY    . EYE SURGERY    . TONSILLECTOMY    . TOTAL HIP ARTHROPLASTY Left 07/23/2019   Procedure: TOTAL HIP ARTHROPLASTY ANTERIOR APPROACH;  Surgeon: Paralee Cancel, MD;  Location: [facility];  Service: Orthopedics;  Laterality: Left;  70 mins    Social History:  reports that she has been smoking cigarettes. She has a 15.00 pack-year smoking history. She has never used smokeless tobacco. She reports current alcohol use of about 9.0 standard drinks of alcohol per week. She reports that she does not use drugs.  Allergies:  Allergies  Allergen Reactions  . Losartan Potassium     Other reaction(s): angioedema     Medications: No current facility-administered medications for this encounter.   Current Outpatient Medications  Medication Sig Dispense Refill  . amLODipine (NORVASC) 2.5 MG tablet Take 2.5 mg by mouth at bedtime.   3  . aspirin 325 MG tablet Take 325 mg by mouth every 6 (six) hours as needed for moderate pain.    . cholecalciferol (VITAMIN D3) 25 MCG (1000 UNIT) tablet Take 1,000 Units by mouth daily.    . hydrochlorothiazide (HYDRODIURIL) 25 MG tablet Take 25 mg by mouth in the morning.    Marland Kitchen LORazepam (ATIVAN) 0.5 MG tablet Take 0.5 mg by mouth daily as needed for anxiety.    . multivitamin-lutein (OCUVITE-LUTEIN) CAPS capsule Take 1 capsule by mouth daily.    . Omega-3 Fatty Acids (FISH OIL) 1000 MG CAPS Take 2,000 mg by mouth daily.    . vitamin B-12 (CYANOCOBALAMIN) 1000 MCG tablet Take 1,000 mcg by mouth daily.    . vitamin C (ASCORBIC ACID) 500 MG tablet Take 500 mg by mouth daily.    Marland Kitchen amoxicillin-clavulanate (AUGMENTIN) 875-125 MG tablet Take 1 tablet by mouth every 12 (twelve) hours. 14 tablet 0  . Spacer/Aero-Holding Chambers (E-Z SPACER) inhaler Use as instructed 1 each 2    No results found for this or any previous visit (from the past 48 hour(s)). No results found.  ROS: Pain with rom of the left upper extremity  Physical Exam: Alert and  oriented 75 y.o. female in no acute distress Cranial nerves 2-12 intact Cervical spine: full rom with no tenderness, nv intact distally Chest: active breath sounds bilaterally, no wheeze rhonchi or rales Heart: regular rate and rhythm, no murmur Abd: non tender non distended with active bowel sounds Hip is stable with rom  Left shoulder painful and weak rom nv intact distally Crepitus with rom  Assessment/Plan Assessment: left shoulder cuff arthropathy  Plan:  Patient will undergo a left reverse shoulder by Dr. Veverly Fells at [facility] Risks benefits and expectations were discussed with the patient. Patient understand risks,  benefits and expectations and wishes to proceed. Preoperative templating of the joint replacement has been completed, documented, and submitted to the Operating Room personnel in order to optimize intra-operative equipment management.   [name] PA-C, MPAS [facility] is now [employer] [address], [place], [place], [place] [address] Phone: [phone] [email or web address] Facebook  [employer]

## 2021-01-26 NOTE — Patient Instructions (Addendum)
DUE TO COVID-19 ONLY ONE VISITOR IS ALLOWED TO COME WITH YOU AND STAY IN THE WAITING ROOM ONLY DURING PRE OP AND PROCEDURE DAY OF SURGERY. THE 1 VISITOR  MAY VISIT WITH YOU AFTER SURGERY IN YOUR PRIVATE ROOM DURING VISITING HOURS ONLY!  YOU NEED TO HAVE A COVID 19 TEST ON_4/27______ @_11 :00______, THIS TEST MUST BE DONE BEFORE SURGERY,  COVID TESTING SITE [place] [id number], IT IS ON THE RIGHT GOING OUT [address] APPROXIMATELY  2 MINUTES PAST [employer] ON THE RIGHT. ONCE YOUR COVID TEST IS COMPLETED,  PLEASE BEGIN THE QUARANTINE INSTRUCTIONS AS OUTLINED IN YOUR HANDOUT.                [name]   Your procedure is scheduled on: 02/05/21   Report to [facility] Main  Entrance   Report to short stay at 5:15 AM     Call this number if you have problems the morning of surgery [phone]    No food after midnight.    You may have clear liquid until 4:30 AM.    At 4:00 AM drink pre surgery drink  . Nothing by mouth after 4:30 AM.   BRUSH YOUR TEETH MORNING OF SURGERY AND RINSE YOUR MOUTH OUT, NO CHEWING GUM CANDY OR MINTS.     Take these medicines the morning of surgery with A SIP OF WATER: Ativan if needed                                 You may not have any metal on your body including hair pins and              piercings  Do not wear jewelry, make-up, lotions, powders or perfumes, deodorant             Do not wear nail polish on your fingernails.  Do not shave  48 hours prior to surgery.     Do not bring valuables to the hospital. [place].  Contacts, dentures or bridgework may not be worn into surgery.      Patients discharged the day of surgery will not be allowed to drive home.   IF YOU ARE HAVING SURGERY AND GOING HOME THE SAME DAY, YOU MUST HAVE AN ADULT TO DRIVE YOU HOME AND BE WITH YOU FOR 24 HOURS.   YOU MAY GO HOME BY TAXI OR UBER OR ORTHERWISE, BUT AN ADULT MUST  ACCOMPANY YOU HOME AND STAY WITH YOU FOR 24 HOURS.  Name and phone number of your driver:  Special Instructions: N/A              Please read over the following fact sheets you were given: _____________________________________________________________________  [facility]- Preparing for Total Shoulder Arthroplasty    Before surgery, you can play an important role. Because skin is not sterile, your skin needs to be as free of germs as possible. You can reduce the number of germs on your skin by using the following products. . Benzoyl Peroxide Gel o Reduces the number of germs present on the skin o Applied twice a day to shoulder area starting two days before surgery    ==================================================================  Please follow these instructions carefully:  BENZOYL PEROXIDE 5% GEL  Please do not use if you have an allergy  to benzoyl peroxide.   If your skin becomes reddened/irritated stop using the benzoyl peroxide.  Starting two days before surgery, apply as follows: 1. Apply benzoyl peroxide in the morning and at night. Apply after taking a shower. If you are not taking a shower clean entire shoulder front, back, and side along with the armpit with a clean wet washcloth.  2. Place a quarter-sized dollop on your shoulder and rub in thoroughly, making sure to cover the front, back, and side of your shoulder, along with the armpit.   2 days before ____ AM   ____ PM              1 day before ____ AM   ____ PM                         3. Do this twice a day for two days.  (Last application is the night before surgery, AFTER using the CHG soap as described below).  4. Do NOT apply benzoyl peroxide gel on the day of surgery.           [facility] - Preparing for Surgery Before surgery, you can play an important role.  Because skin is not sterile, your skin needs to be as free of germs as possible.  You can reduce the number of germs on your skin by washing with CHG  (chlorahexidine gluconate) soap before surgery.  CHG is an antiseptic cleaner which kills germs and bonds with the skin to continue killing germs even after washing. Please DO NOT use if you have an allergy to CHG or antibacterial soaps.  If your skin becomes reddened/irritated stop using the CHG and inform your nurse when you arrive at Short Stay. Do not shave (including legs and underarms) for at least 48 hours prior to the first CHG shower.    Please follow these instructions carefully:  1.  Shower with CHG Soap the night before surgery and the  morning of Surgery.  2.  If you choose to wash your hair, wash your hair first as usual with your  normal  shampoo.  3.  After you shampoo, rinse your hair and body thoroughly to remove the  shampoo.                                        4.  Use CHG as you would any other liquid soap.  You can apply chg directly  to the skin and wash                       Gently with a scrungie or clean washcloth.  5.  Apply the CHG Soap to your body ONLY FROM THE NECK DOWN.   Do not use on face/ open                           Wound or open sores. Avoid contact with eyes, ears mouth and genitals (private parts).                       Wash face,  Genitals (private parts) with your normal soap.             6.  Wash thoroughly, paying special attention to the area where your surgery  will be performed.  7.  Thoroughly rinse your body with warm water from the neck down.  8.  DO NOT shower/wash with your normal soap after using and rinsing off  the CHG Soap.             9.  Pat yourself dry with a clean towel.            10.  Wear clean pajamas.            11.  Place clean sheets on your bed the night of your first shower and do not  sleep with pets. Day of Surgery : Do not apply any lotions/deodorants the morning of surgery.  Please wear clean clothes to the hospital/surgery center.  FAILURE TO FOLLOW THESE INSTRUCTIONS MAY RESULT IN THE CANCELLATION OF YOUR  SURGERY PATIENT SIGNATURE_________________________________  NURSE SIGNATURE__________________________________  ________________________________________________________________________

## 2021-01-27 ENCOUNTER — Encounter (HOSPITAL_COMMUNITY)
Admission: RE | Admit: 2021-01-27 | Discharge: 2021-01-27 | Disposition: A | Discharge status: Home / Self Care | Payer: Medicare HMO | Source: Ambulatory Visit | Attending: Orthopedic Surgery | Admitting: Orthopedic Surgery

## 2021-01-27 ENCOUNTER — Encounter (HOSPITAL_COMMUNITY): Payer: Self-pay

## 2021-01-27 ENCOUNTER — Other Ambulatory Visit: Payer: Self-pay

## 2021-01-27 DIAGNOSIS — Z01812 Encounter for preprocedural laboratory examination: Secondary | ICD-10-CM | POA: Diagnosis not present

## 2021-01-27 HISTORY — DX: Other complications of anesthesia, initial encounter: T88.59XA

## 2021-01-27 LAB — SURGICAL PCR SCREEN
MRSA, PCR: NEGATIVE
Staphylococcus aureus: NEGATIVE

## 2021-01-27 LAB — COMPREHENSIVE METABOLIC PANEL
ALT: 21 U/L (ref 0–44)
AST: 21 U/L (ref 15–41)
Albumin: 4.1 g/dL (ref 3.5–5.0)
Alkaline Phosphatase: 82 U/L (ref 38–126)
Anion gap: 10 (ref 5–15)
BUN: 8 mg/dL (ref 8–23)
CO2: 30 mmol/L (ref 22–32)
Calcium: 9.4 mg/dL (ref 8.9–10.3)
Chloride: 91 mmol/L — ABNORMAL LOW (ref 98–111)
Creatinine, Ser: 0.46 mg/dL (ref 0.44–1.00)
GFR, Estimated: >60 mL/min (ref >60)
Glucose, Bld: 107 mg/dL — ABNORMAL HIGH (ref 70–99)
Potassium: 3.9 mmol/L (ref 3.5–5.1)
Sodium: 131 mmol/L — ABNORMAL LOW (ref 135–145)
Total Bilirubin: 0.9 mg/dL (ref 0.3–1.2)
Total Protein: 7.3 g/dL (ref 6.5–8.1)

## 2021-01-27 LAB — CBC
HCT: 44.3 % (ref 36.0–46.0)
Hemoglobin: 15.1 g/dL — ABNORMAL HIGH (ref 12.0–15.0)
MCH: 30.6 pg (ref 26.0–34.0)
MCHC: 34.1 g/dL (ref 30.0–36.0)
MCV: 89.7 fL (ref 80.0–100.0)
Platelets: 358 10*3/uL (ref 150–400)
RBC: 4.94 MIL/uL (ref 3.87–5.11)
RDW: 13.1 % (ref 11.5–15.5)
WBC: 9 10*3/uL (ref 4.0–10.5)
nRBC: 0 % (ref 0.0–0.2)

## 2021-01-27 NOTE — Progress Notes (Signed)
COVID Vaccine Completed:yes Date COVID Vaccine completed:01/29/2020 COVID vaccine manufacturer: [employer]     PCP - [facility] Cardiologist - none  Chest x-ray - 10/25/20-epic EKG - 10/26/20-epic Stress Test - no ECHO - no Cardiac Cath - no Pacemaker/ICD device last checked:NA  Sleep Study - no CPAP -   Fasting Blood Sugar -NA  Checks Blood Sugar _____ times a day  Blood Thinner Instructions:ASA 325/ Aspirin Instructions: Last Dose:01/29/21  Anesthesia review:   Patient denies shortness of breath, fever, cough and chest pain at PAT appointment  yes Patient verbalized understanding of instructions that were given to them at the PAT appointment. Patient was also instructed that they will need to review over the PAT instructions again at home before surgery.yes Pt stopped smoking in June 2021. She reports some SOB climbing stairs but not with doing housework or ADLs. She has an episode of SOB after her hip surgery that scared her. She would like to talk to Dr. Veverly Fells before signing her consent. She has a tooth pulled 01/18/21 due to infection and is taking antibiotic.

## 2021-01-28 ENCOUNTER — Encounter (HOSPITAL_COMMUNITY): Payer: Self-pay | Admitting: Emergency Medicine

## 2021-01-28 DIAGNOSIS — M9901 Segmental and somatic dysfunction of cervical region: Secondary | ICD-10-CM | POA: Diagnosis not present

## 2021-01-28 DIAGNOSIS — M62830 Muscle spasm of back: Secondary | ICD-10-CM | POA: Diagnosis not present

## 2021-01-28 DIAGNOSIS — M9903 Segmental and somatic dysfunction of lumbar region: Secondary | ICD-10-CM | POA: Diagnosis not present

## 2021-01-28 DIAGNOSIS — M5136 Other intervertebral disc degeneration, lumbar region: Secondary | ICD-10-CM | POA: Diagnosis not present

## 2021-02-03 ENCOUNTER — Other Ambulatory Visit (HOSPITAL_COMMUNITY): Payer: Medicare HMO

## 2021-02-05 ENCOUNTER — Encounter (HOSPITAL_COMMUNITY): Admission: RE | Payer: Self-pay | Source: Ambulatory Visit

## 2021-02-05 ENCOUNTER — Ambulatory Visit (HOSPITAL_COMMUNITY): Admission: RE | Admit: 2021-02-05 | Payer: Medicare HMO | Source: Ambulatory Visit | Admitting: Orthopedic Surgery

## 2021-02-05 SURGERY — ARTHROPLASTY, SHOULDER, TOTAL, REVERSE
Anesthesia: General | Site: Shoulder | Laterality: Left

## 2021-02-11 DIAGNOSIS — M5136 Other intervertebral disc degeneration, lumbar region: Secondary | ICD-10-CM | POA: Diagnosis not present

## 2021-02-11 DIAGNOSIS — M9901 Segmental and somatic dysfunction of cervical region: Secondary | ICD-10-CM | POA: Diagnosis not present

## 2021-02-11 DIAGNOSIS — M9903 Segmental and somatic dysfunction of lumbar region: Secondary | ICD-10-CM | POA: Diagnosis not present

## 2021-02-11 DIAGNOSIS — M62830 Muscle spasm of back: Secondary | ICD-10-CM | POA: Diagnosis not present

## 2021-02-15 DIAGNOSIS — M8580 Other specified disorders of bone density and structure, unspecified site: Secondary | ICD-10-CM | POA: Diagnosis not present

## 2021-02-15 DIAGNOSIS — M160 Bilateral primary osteoarthritis of hip: Secondary | ICD-10-CM | POA: Diagnosis not present

## 2021-02-15 DIAGNOSIS — D582 Other hemoglobinopathies: Secondary | ICD-10-CM | POA: Diagnosis not present

## 2021-02-15 DIAGNOSIS — I10 Essential (primary) hypertension: Secondary | ICD-10-CM | POA: Diagnosis not present

## 2021-02-15 DIAGNOSIS — J449 Chronic obstructive pulmonary disease, unspecified: Secondary | ICD-10-CM | POA: Diagnosis not present

## 2021-02-23 NOTE — Patient Instructions (Addendum)
DUE TO COVID-19 ONLY ONE VISITOR IS ALLOWED TO COME WITH YOU AND STAY IN THE WAITING ROOM ONLY DURING PRE OP AND PROCEDURE DAY OF SURGERY. THE 2 VISITORS  MAY VISIT WITH YOU AFTER SURGERY IN YOUR PRIVATE ROOM DURING VISITING HOURS ONLY!  YOU NEED TO HAVE A COVID 19 TEST ON_5/18______ @_2 :50______, THIS TEST MUST BE DONE BEFORE SURGERY,  COVID TESTING SITE [place] [address], IT IS ON THE RIGHT GOING OUT [address] APPROXIMATELY  2 MINUTES PAST [employer] ON THE RIGHT. ONCE YOUR COVID TEST IS COMPLETED,  PLEASE BEGIN THE QUARANTINE INSTRUCTIONS AS OUTLINED IN YOUR HANDOUT.                [name]   Your procedure is scheduled on: 02/26/21   Report to [facility] Main  Entrance   Report to short stay at 5:15 AM     Call this number if you have problems the morning of surgery [place], NO CHEWING GUM [place].   No food after midnight.    You may have clear liquid until 4:30 AM.    At 4:00 AM drink pre surgery drink.   Nothing by mouth after 4:30 AM.   Take these medicines the morning of surgery with A SIP OF WATER: none. Ativan if needed                                 You may not have any metal on your body including hair pins and              piercings  Do not wear jewelry, make-up, lotions, powders or perfumes, deodorant             Do not wear nail polish on your fingernails.  Do not shave  48 hours prior to surgery.     Do not bring valuables to the hospital. [place].  Contacts, dentures or bridgework may not be worn into surgery.      Patients discharged the day of surgery will not be allowed to drive home. IF YOU ARE HAVING SURGERY AND GOING HOME THE SAM             Please read over the following fact sheets you were  given: _____________________________________________________________________             [facility]- Preparing for Total Shoulder Arthroplasty    Before surgery, you can play an important role. Because skin is not sterile, your skin needs to be as free of germs as possible. You can reduce the number of germs on your skin by using the following products. . Benzoyl Peroxide Gel o Reduces the number of germs present on the skin o Applied twice a day to shoulder area starting two days before surgery    ==================================================================  Please follow these instructions carefully:  BENZOYL PEROXIDE 5% GEL  Please do not use if you have an allergy to benzoyl peroxide.   If your skin becomes reddened/irritated stop using the benzoyl peroxide.  Starting two days before surgery, apply as follows: 1. Apply benzoyl peroxide in the morning and at night. Apply after taking a shower. If you are not taking a shower clean  entire shoulder front, back, and side along with the armpit with a clean wet washcloth.  2. Place a quarter-sized dollop on your shoulder and rub in thoroughly, making sure to cover the front, back, and side of your shoulder, along with the armpit.   2 days before ____ AM   ____ PM              1 day before ____ AM   ____ PM                         3. Do this twice a day for two days.  (Last application is the night before surgery, AFTER using the CHG soap as described below).  4. Do NOT apply benzoyl peroxide gel on the day of surgery.   [place] - Preparing for Surgery Before surgery, you can play an important role.  Because skin is not sterile, your skin needs to be as free of germs as possible.  You can reduce the number of germs on your skin by washing with CHG (chlorahexidine gluconate) soap before surgery.  CHG is an antiseptic cleaner which kills germs and bonds with the skin to continue killing germs even after washing. Please DO NOT  use if you have an allergy to CHG or antibacterial soaps.  If your skin becomes reddened/irritated stop using the CHG and inform your nurse when you arrive at Short Stay. Do not shave (including legs and underarms) for at least 48 hours prior to the first CHG shower.   . Please follow these instructions carefully:  1.  Shower with CHG Soap the night before surgery and the  morning of Surgery.  2.  If you choose to wash your hair, wash your hair first as usual with your  normal  shampoo.  3.  After you shampoo, rinse your hair and body thoroughly to remove the  shampoo.                                        4.  Use CHG as you would any other liquid soap.  You can apply chg directly  to the skin and wash                       Gently with a scrungie or clean washcloth.  5.  Apply the CHG Soap to your body ONLY FROM THE NECK DOWN.   Do not use on face/ open                           Wound or open sores. Avoid contact with eyes, ears mouth and genitals (private parts).                       Wash face,  Genitals (private parts) with your normal soap.             6.  Wash thoroughly, paying special attention to the area where your surgery  will be performed.  7.  Thoroughly rinse your body with warm water from the neck down.  8.  DO NOT shower/wash with your normal soap after using and rinsing off  the CHG Soap.             9.  Pat yourself dry with a clean towel.  10.  Wear clean pajamas.            11.  Place clean sheets on your bed the night of your first shower and do not  sleep with pets. Day of Surgery : Do not apply any lotions/deodorants the morning of surgery.  Please wear clean clothes to the hospital/surgery center.  FAILURE TO FOLLOW THESE INSTRUCTIONS MAY RESULT IN THE CANCELLATION OF YOUR SURGERY PATIENT SIGNATURE_________________________________  NURSE  SIGNATURE__________________________________  ________________________________________________________________________   Adam Phenix  An incentive spirometer is a tool that can help keep your lungs clear and active. This tool measures how well you are filling your lungs with each breath. Taking long deep breaths may help reverse or decrease the chance of developing breathing (pulmonary) problems (especially infection) following:  A long period of time when you are unable to move or be active. BEFORE THE PROCEDURE   If the spirometer includes an indicator to show your best effort, your nurse or respiratory therapist will set it to a desired goal.  If possible, sit up straight or lean slightly forward. Try not to slouch.  Hold the incentive spirometer in an upright position. INSTRUCTIONS FOR USE  1. Sit on the edge of your bed if possible, or sit up as far as you can in bed or on a chair. 2. Hold the incentive spirometer in an upright position. 3. Breathe out normally. 4. Place the mouthpiece in your mouth and seal your lips tightly around it. 5. Breathe in slowly and as deeply as possible, raising the piston or the ball toward the top of the column. 6. Hold your breath for 3-5 seconds or for as long as possible. Allow the piston or ball to fall to the bottom of the column. 7. Remove the mouthpiece from your mouth and breathe out normally. 8. Rest for a few seconds and repeat Steps 1 through 7 at least 10 times every 1-2 hours when you are awake. Take your time and take a few normal breaths between deep breaths. 9. The spirometer may include an indicator to show your best effort. Use the indicator as a goal to work toward during each repetition. 10. After each set of 10 deep breaths, practice coughing to be sure your lungs are clear. If you have an incision (the cut made at the time of surgery), support your incision when coughing by placing a pillow or rolled up towels firmly  against it. Once you are able to get out of bed, walk around indoors and cough well. You may stop using the incentive spirometer when instructed by your caregiver.  RISKS AND COMPLICATIONS  Take your time so you do not get dizzy or light-headed.  If you are in pain, you may need to take or ask for pain medication before doing incentive spirometry. It is harder to take a deep breath if you are having pain. AFTER USE  Rest and breathe slowly and easily.  It can be helpful to keep track of a log of your progress. Your caregiver can provide you with a simple table to help with this. If you are using the spirometer at home, follow these instructions: [place] IF:   You are having difficultly using the spirometer.  You have trouble using the spirometer as often as instructed.  Your pain medication is not giving enough relief while using the spirometer.  You develop fever of 100.5 F (38.1 C) or higher. SEEK IMMEDIATE MEDICAL CARE IF:   You cough up bloody  sputum that had not been present before.  You develop fever of 102 F (38.9 C) or greater.  You develop worsening pain at or near the incision site. MAKE SURE YOU:   Understand these instructions.  Will watch your condition.  Will get help right away if you are not doing well or get worse. Document Released: 02/06/2007 Document Revised: 12/19/2011 Document Reviewed: 04/09/2007 [facility] Patient Information 2014 [place], [place].   ________________________________________________________________________

## 2021-02-24 ENCOUNTER — Encounter (HOSPITAL_COMMUNITY): Payer: Self-pay

## 2021-02-24 ENCOUNTER — Other Ambulatory Visit: Payer: Self-pay

## 2021-02-24 ENCOUNTER — Other Ambulatory Visit (HOSPITAL_COMMUNITY)
Admission: RE | Admit: 2021-02-24 | Discharge: 2021-02-24 | Disposition: A | Discharge status: Home / Self Care | Payer: Medicare HMO | Source: Ambulatory Visit | Attending: Orthopedic Surgery | Admitting: Orthopedic Surgery

## 2021-02-24 ENCOUNTER — Encounter (HOSPITAL_COMMUNITY)
Admission: RE | Admit: 2021-02-24 | Discharge: 2021-02-24 | Disposition: A | Discharge status: Home / Self Care | Payer: Medicare HMO | Source: Ambulatory Visit | Attending: Orthopedic Surgery | Admitting: Orthopedic Surgery

## 2021-02-24 DIAGNOSIS — Z01812 Encounter for preprocedural laboratory examination: Secondary | ICD-10-CM | POA: Diagnosis not present

## 2021-02-24 DIAGNOSIS — Z20822 Contact with and (suspected) exposure to covid-19: Secondary | ICD-10-CM | POA: Diagnosis not present

## 2021-02-24 LAB — COMPREHENSIVE METABOLIC PANEL
ALT: 28 U/L (ref 0–44)
AST: 30 U/L (ref 15–41)
Albumin: 4.5 g/dL (ref 3.5–5.0)
Alkaline Phosphatase: 101 U/L (ref 38–126)
Anion gap: 13 (ref 5–15)
BUN: 7 mg/dL — ABNORMAL LOW (ref 8–23)
CO2: 30 mmol/L (ref 22–32)
Calcium: 10 mg/dL (ref 8.9–10.3)
Chloride: 88 mmol/L — ABNORMAL LOW (ref 98–111)
Creatinine, Ser: 0.44 mg/dL (ref 0.44–1.00)
GFR, Estimated: >60 mL/min (ref >60)
Glucose, Bld: 112 mg/dL — ABNORMAL HIGH (ref 70–99)
Potassium: 3.6 mmol/L (ref 3.5–5.1)
Sodium: 131 mmol/L — ABNORMAL LOW (ref 135–145)
Total Bilirubin: 1.4 mg/dL — ABNORMAL HIGH (ref 0.3–1.2)
Total Protein: 8 g/dL (ref 6.5–8.1)

## 2021-02-24 LAB — CBC
HCT: 45.7 % (ref 36.0–46.0)
Hemoglobin: 15.4 g/dL — ABNORMAL HIGH (ref 12.0–15.0)
MCH: 30.3 pg (ref 26.0–34.0)
MCHC: 33.7 g/dL (ref 30.0–36.0)
MCV: 89.8 fL (ref 80.0–100.0)
Platelets: 351 10*3/uL (ref 150–400)
RBC: 5.09 MIL/uL (ref 3.87–5.11)
RDW: 14.3 % (ref 11.5–15.5)
WBC: 6.7 10*3/uL (ref 4.0–10.5)
nRBC: 0 % (ref 0.0–0.2)

## 2021-02-24 LAB — SURGICAL PCR SCREEN
MRSA, PCR: NEGATIVE
Staphylococcus aureus: NEGATIVE

## 2021-02-24 LAB — SARS CORONAVIRUS 2 (TAT 6-24 HRS): SARS Coronavirus 2: NEGATIVE

## 2021-02-24 NOTE — Progress Notes (Signed)
COVID Vaccine Completed:Yes Date COVID Vaccine completed:4/21 COVID vaccine manufacturer: [place]    PCP - [facility] Cardiologist - no  Chest x-ray - 10/25/20-epic EKG - 10/26/20-epic Stress Test - no ECHO - no Cardiac Cath - no Pacemaker/ICD device last checked:NA  Sleep Study -no  CPAP -   Fasting Blood Sugar - NA Checks Blood Sugar _____ times a day  Blood Thinner Instructions:NA Aspirin Instructions: Last Dose:  Anesthesia review:   Patient denies shortness of breath, fever, cough and chest pain at PAT appointment Yes.   Patient verbalized understanding of instructions that were given to them at the PAT appointment. Patient was also instructed that they will need to review over the PAT instructions again at home before surgery.Yes  Pt is angry that her surgery was cancelled. She was unhappy about having to go to [place] for a covid test.  She felt that the information that she received was not detailed enough. I tried to address her concerns, defuse her anger and apologize for any shortcomings.

## 2021-02-26 ENCOUNTER — Encounter (HOSPITAL_COMMUNITY): Admission: RE | Payer: Self-pay | Source: Other Acute Inpatient Hospital

## 2021-02-26 ENCOUNTER — Ambulatory Visit (HOSPITAL_COMMUNITY)
Admission: RE | Admit: 2021-02-26 | Payer: Medicare HMO | Source: Other Acute Inpatient Hospital | Admitting: Orthopedic Surgery

## 2021-02-26 SURGERY — ARTHROPLASTY, SHOULDER, TOTAL, REVERSE
Anesthesia: General | Site: Shoulder | Laterality: Left

## 2021-03-04 DIAGNOSIS — Z961 Presence of intraocular lens: Secondary | ICD-10-CM | POA: Diagnosis not present

## 2021-03-04 DIAGNOSIS — H353123 Nonexudative age-related macular degeneration, left eye, advanced atrophic without subfoveal involvement: Secondary | ICD-10-CM | POA: Diagnosis not present

## 2021-03-04 DIAGNOSIS — H353113 Nonexudative age-related macular degeneration, right eye, advanced atrophic without subfoveal involvement: Secondary | ICD-10-CM | POA: Diagnosis not present

## 2021-04-27 DIAGNOSIS — M545 Low back pain, unspecified: Secondary | ICD-10-CM | POA: Diagnosis not present

## 2021-05-04 DIAGNOSIS — M62830 Muscle spasm of back: Secondary | ICD-10-CM | POA: Diagnosis not present

## 2021-05-04 DIAGNOSIS — M9901 Segmental and somatic dysfunction of cervical region: Secondary | ICD-10-CM | POA: Diagnosis not present

## 2021-05-04 DIAGNOSIS — M5136 Other intervertebral disc degeneration, lumbar region: Secondary | ICD-10-CM | POA: Diagnosis not present

## 2021-05-04 DIAGNOSIS — M9903 Segmental and somatic dysfunction of lumbar region: Secondary | ICD-10-CM | POA: Diagnosis not present

## 2021-05-11 DIAGNOSIS — M9901 Segmental and somatic dysfunction of cervical region: Secondary | ICD-10-CM | POA: Diagnosis not present

## 2021-05-11 DIAGNOSIS — M5136 Other intervertebral disc degeneration, lumbar region: Secondary | ICD-10-CM | POA: Diagnosis not present

## 2021-05-11 DIAGNOSIS — M9903 Segmental and somatic dysfunction of lumbar region: Secondary | ICD-10-CM | POA: Diagnosis not present

## 2021-05-11 DIAGNOSIS — M62830 Muscle spasm of back: Secondary | ICD-10-CM | POA: Diagnosis not present

## 2021-05-18 DIAGNOSIS — M9903 Segmental and somatic dysfunction of lumbar region: Secondary | ICD-10-CM | POA: Diagnosis not present

## 2021-05-18 DIAGNOSIS — M9901 Segmental and somatic dysfunction of cervical region: Secondary | ICD-10-CM | POA: Diagnosis not present

## 2021-05-18 DIAGNOSIS — M5136 Other intervertebral disc degeneration, lumbar region: Secondary | ICD-10-CM | POA: Diagnosis not present

## 2021-05-18 DIAGNOSIS — M62830 Muscle spasm of back: Secondary | ICD-10-CM | POA: Diagnosis not present

## 2021-05-27 DIAGNOSIS — M9903 Segmental and somatic dysfunction of lumbar region: Secondary | ICD-10-CM | POA: Diagnosis not present

## 2021-05-27 DIAGNOSIS — M62830 Muscle spasm of back: Secondary | ICD-10-CM | POA: Diagnosis not present

## 2021-05-27 DIAGNOSIS — M5136 Other intervertebral disc degeneration, lumbar region: Secondary | ICD-10-CM | POA: Diagnosis not present

## 2021-05-27 DIAGNOSIS — M9901 Segmental and somatic dysfunction of cervical region: Secondary | ICD-10-CM | POA: Diagnosis not present

## 2021-06-01 DIAGNOSIS — M9901 Segmental and somatic dysfunction of cervical region: Secondary | ICD-10-CM | POA: Diagnosis not present

## 2021-06-01 DIAGNOSIS — M9903 Segmental and somatic dysfunction of lumbar region: Secondary | ICD-10-CM | POA: Diagnosis not present

## 2021-06-01 DIAGNOSIS — M62830 Muscle spasm of back: Secondary | ICD-10-CM | POA: Diagnosis not present

## 2021-06-01 DIAGNOSIS — M5136 Other intervertebral disc degeneration, lumbar region: Secondary | ICD-10-CM | POA: Diagnosis not present

## 2021-06-03 DIAGNOSIS — I10 Essential (primary) hypertension: Secondary | ICD-10-CM | POA: Diagnosis not present

## 2021-06-03 DIAGNOSIS — M8580 Other specified disorders of bone density and structure, unspecified site: Secondary | ICD-10-CM | POA: Diagnosis not present

## 2021-06-03 DIAGNOSIS — M19011 Primary osteoarthritis, right shoulder: Secondary | ICD-10-CM | POA: Diagnosis not present

## 2021-06-03 DIAGNOSIS — D582 Other hemoglobinopathies: Secondary | ICD-10-CM | POA: Diagnosis not present

## 2021-06-03 DIAGNOSIS — J449 Chronic obstructive pulmonary disease, unspecified: Secondary | ICD-10-CM | POA: Diagnosis not present

## 2021-06-03 DIAGNOSIS — M19012 Primary osteoarthritis, left shoulder: Secondary | ICD-10-CM | POA: Diagnosis not present

## 2021-06-03 DIAGNOSIS — M160 Bilateral primary osteoarthritis of hip: Secondary | ICD-10-CM | POA: Diagnosis not present

## 2021-06-07 DIAGNOSIS — M5136 Other intervertebral disc degeneration, lumbar region: Secondary | ICD-10-CM | POA: Diagnosis not present

## 2021-06-07 DIAGNOSIS — M545 Low back pain, unspecified: Secondary | ICD-10-CM | POA: Diagnosis not present

## 2021-06-08 DIAGNOSIS — M9901 Segmental and somatic dysfunction of cervical region: Secondary | ICD-10-CM | POA: Diagnosis not present

## 2021-06-08 DIAGNOSIS — M9903 Segmental and somatic dysfunction of lumbar region: Secondary | ICD-10-CM | POA: Diagnosis not present

## 2021-06-08 DIAGNOSIS — M5136 Other intervertebral disc degeneration, lumbar region: Secondary | ICD-10-CM | POA: Diagnosis not present

## 2021-06-08 DIAGNOSIS — M62830 Muscle spasm of back: Secondary | ICD-10-CM | POA: Diagnosis not present

## 2021-06-15 DIAGNOSIS — K625 Hemorrhage of anus and rectum: Secondary | ICD-10-CM | POA: Diagnosis not present

## 2021-06-15 DIAGNOSIS — K5900 Constipation, unspecified: Secondary | ICD-10-CM | POA: Diagnosis not present

## 2021-06-22 DIAGNOSIS — M5136 Other intervertebral disc degeneration, lumbar region: Secondary | ICD-10-CM | POA: Diagnosis not present

## 2021-06-22 DIAGNOSIS — M62830 Muscle spasm of back: Secondary | ICD-10-CM | POA: Diagnosis not present

## 2021-06-22 DIAGNOSIS — M9903 Segmental and somatic dysfunction of lumbar region: Secondary | ICD-10-CM | POA: Diagnosis not present

## 2021-06-22 DIAGNOSIS — M9901 Segmental and somatic dysfunction of cervical region: Secondary | ICD-10-CM | POA: Diagnosis not present

## 2021-06-29 DIAGNOSIS — M9901 Segmental and somatic dysfunction of cervical region: Secondary | ICD-10-CM | POA: Diagnosis not present

## 2021-06-29 DIAGNOSIS — M62830 Muscle spasm of back: Secondary | ICD-10-CM | POA: Diagnosis not present

## 2021-06-29 DIAGNOSIS — M9903 Segmental and somatic dysfunction of lumbar region: Secondary | ICD-10-CM | POA: Diagnosis not present

## 2021-06-29 DIAGNOSIS — M5136 Other intervertebral disc degeneration, lumbar region: Secondary | ICD-10-CM | POA: Diagnosis not present

## 2021-07-06 DIAGNOSIS — M62830 Muscle spasm of back: Secondary | ICD-10-CM | POA: Diagnosis not present

## 2021-07-06 DIAGNOSIS — M5136 Other intervertebral disc degeneration, lumbar region: Secondary | ICD-10-CM | POA: Diagnosis not present

## 2021-07-06 DIAGNOSIS — M9901 Segmental and somatic dysfunction of cervical region: Secondary | ICD-10-CM | POA: Diagnosis not present

## 2021-07-06 DIAGNOSIS — M9903 Segmental and somatic dysfunction of lumbar region: Secondary | ICD-10-CM | POA: Diagnosis not present

## 2021-07-16 DIAGNOSIS — R300 Dysuria: Secondary | ICD-10-CM | POA: Diagnosis not present

## 2021-07-16 DIAGNOSIS — R319 Hematuria, unspecified: Secondary | ICD-10-CM | POA: Diagnosis not present

## 2021-07-16 DIAGNOSIS — I10 Essential (primary) hypertension: Secondary | ICD-10-CM | POA: Diagnosis not present

## 2021-07-20 DIAGNOSIS — M5136 Other intervertebral disc degeneration, lumbar region: Secondary | ICD-10-CM | POA: Diagnosis not present

## 2021-07-20 DIAGNOSIS — M9903 Segmental and somatic dysfunction of lumbar region: Secondary | ICD-10-CM | POA: Diagnosis not present

## 2021-07-20 DIAGNOSIS — M9901 Segmental and somatic dysfunction of cervical region: Secondary | ICD-10-CM | POA: Diagnosis not present

## 2021-07-20 DIAGNOSIS — M62830 Muscle spasm of back: Secondary | ICD-10-CM | POA: Diagnosis not present

## 2021-07-22 ENCOUNTER — Encounter (HOSPITAL_COMMUNITY): Payer: Medicare HMO

## 2021-07-30 ENCOUNTER — Ambulatory Visit: Admit: 2021-07-30 | Payer: Medicare HMO | Admitting: Orthopedic Surgery

## 2021-07-30 SURGERY — ARTHROPLASTY, SHOULDER, TOTAL, REVERSE
Anesthesia: General | Site: Shoulder | Laterality: Left

## 2021-08-02 DIAGNOSIS — J449 Chronic obstructive pulmonary disease, unspecified: Secondary | ICD-10-CM | POA: Diagnosis not present

## 2021-08-02 DIAGNOSIS — D582 Other hemoglobinopathies: Secondary | ICD-10-CM | POA: Diagnosis not present

## 2021-08-02 DIAGNOSIS — I10 Essential (primary) hypertension: Secondary | ICD-10-CM | POA: Diagnosis not present

## 2021-08-02 DIAGNOSIS — M19012 Primary osteoarthritis, left shoulder: Secondary | ICD-10-CM | POA: Diagnosis not present

## 2021-08-02 DIAGNOSIS — M160 Bilateral primary osteoarthritis of hip: Secondary | ICD-10-CM | POA: Diagnosis not present

## 2021-08-02 DIAGNOSIS — M19011 Primary osteoarthritis, right shoulder: Secondary | ICD-10-CM | POA: Diagnosis not present

## 2021-08-02 DIAGNOSIS — M8580 Other specified disorders of bone density and structure, unspecified site: Secondary | ICD-10-CM | POA: Diagnosis not present

## 2021-08-03 DIAGNOSIS — M9903 Segmental and somatic dysfunction of lumbar region: Secondary | ICD-10-CM | POA: Diagnosis not present

## 2021-08-03 DIAGNOSIS — M9901 Segmental and somatic dysfunction of cervical region: Secondary | ICD-10-CM | POA: Diagnosis not present

## 2021-08-03 DIAGNOSIS — M5136 Other intervertebral disc degeneration, lumbar region: Secondary | ICD-10-CM | POA: Diagnosis not present

## 2021-08-03 DIAGNOSIS — M62830 Muscle spasm of back: Secondary | ICD-10-CM | POA: Diagnosis not present

## 2021-08-10 DIAGNOSIS — R310 Gross hematuria: Secondary | ICD-10-CM | POA: Diagnosis not present

## 2021-08-11 DIAGNOSIS — K449 Diaphragmatic hernia without obstruction or gangrene: Secondary | ICD-10-CM | POA: Diagnosis not present

## 2021-08-11 DIAGNOSIS — R310 Gross hematuria: Secondary | ICD-10-CM | POA: Diagnosis not present

## 2021-08-11 DIAGNOSIS — I700 Atherosclerosis of aorta: Secondary | ICD-10-CM | POA: Diagnosis not present

## 2021-08-12 DIAGNOSIS — R310 Gross hematuria: Secondary | ICD-10-CM | POA: Diagnosis not present

## 2021-08-17 DIAGNOSIS — M9901 Segmental and somatic dysfunction of cervical region: Secondary | ICD-10-CM | POA: Diagnosis not present

## 2021-08-17 DIAGNOSIS — M9903 Segmental and somatic dysfunction of lumbar region: Secondary | ICD-10-CM | POA: Diagnosis not present

## 2021-08-17 DIAGNOSIS — M62830 Muscle spasm of back: Secondary | ICD-10-CM | POA: Diagnosis not present

## 2021-08-17 DIAGNOSIS — M5136 Other intervertebral disc degeneration, lumbar region: Secondary | ICD-10-CM | POA: Diagnosis not present

## 2021-08-25 ENCOUNTER — Other Ambulatory Visit: Payer: Self-pay

## 2021-08-25 DIAGNOSIS — I739 Peripheral vascular disease, unspecified: Secondary | ICD-10-CM

## 2021-08-31 DIAGNOSIS — M62830 Muscle spasm of back: Secondary | ICD-10-CM | POA: Diagnosis not present

## 2021-08-31 DIAGNOSIS — M9903 Segmental and somatic dysfunction of lumbar region: Secondary | ICD-10-CM | POA: Diagnosis not present

## 2021-08-31 DIAGNOSIS — M9901 Segmental and somatic dysfunction of cervical region: Secondary | ICD-10-CM | POA: Diagnosis not present

## 2021-08-31 DIAGNOSIS — M5136 Other intervertebral disc degeneration, lumbar region: Secondary | ICD-10-CM | POA: Diagnosis not present

## 2021-09-07 DIAGNOSIS — M9903 Segmental and somatic dysfunction of lumbar region: Secondary | ICD-10-CM | POA: Diagnosis not present

## 2021-09-07 DIAGNOSIS — M5136 Other intervertebral disc degeneration, lumbar region: Secondary | ICD-10-CM | POA: Diagnosis not present

## 2021-09-07 DIAGNOSIS — M9901 Segmental and somatic dysfunction of cervical region: Secondary | ICD-10-CM | POA: Diagnosis not present

## 2021-09-07 DIAGNOSIS — M62830 Muscle spasm of back: Secondary | ICD-10-CM | POA: Diagnosis not present

## 2021-09-14 DIAGNOSIS — M62830 Muscle spasm of back: Secondary | ICD-10-CM | POA: Diagnosis not present

## 2021-09-14 DIAGNOSIS — M9903 Segmental and somatic dysfunction of lumbar region: Secondary | ICD-10-CM | POA: Diagnosis not present

## 2021-09-14 DIAGNOSIS — M5136 Other intervertebral disc degeneration, lumbar region: Secondary | ICD-10-CM | POA: Diagnosis not present

## 2021-09-14 DIAGNOSIS — M9901 Segmental and somatic dysfunction of cervical region: Secondary | ICD-10-CM | POA: Diagnosis not present

## 2021-09-20 DIAGNOSIS — D120 Benign neoplasm of cecum: Secondary | ICD-10-CM | POA: Diagnosis not present

## 2021-09-20 DIAGNOSIS — Z86010 Personal history of colonic polyps: Secondary | ICD-10-CM | POA: Diagnosis not present

## 2021-09-21 DIAGNOSIS — M9903 Segmental and somatic dysfunction of lumbar region: Secondary | ICD-10-CM | POA: Diagnosis not present

## 2021-09-21 DIAGNOSIS — M5136 Other intervertebral disc degeneration, lumbar region: Secondary | ICD-10-CM | POA: Diagnosis not present

## 2021-09-21 DIAGNOSIS — M9901 Segmental and somatic dysfunction of cervical region: Secondary | ICD-10-CM | POA: Diagnosis not present

## 2021-09-21 DIAGNOSIS — M62830 Muscle spasm of back: Secondary | ICD-10-CM | POA: Diagnosis not present

## 2021-09-22 ENCOUNTER — Ambulatory Visit (HOSPITAL_COMMUNITY): Payer: Medicare HMO

## 2021-09-22 DIAGNOSIS — D120 Benign neoplasm of cecum: Secondary | ICD-10-CM | POA: Diagnosis not present

## 2021-09-23 ENCOUNTER — Encounter: Payer: Medicare HMO | Admitting: Vascular Surgery

## 2021-09-23 ENCOUNTER — Encounter (HOSPITAL_COMMUNITY): Payer: Medicare HMO

## 2021-09-23 DIAGNOSIS — H353123 Nonexudative age-related macular degeneration, left eye, advanced atrophic without subfoveal involvement: Secondary | ICD-10-CM | POA: Diagnosis not present

## 2021-09-23 DIAGNOSIS — H353114 Nonexudative age-related macular degeneration, right eye, advanced atrophic with subfoveal involvement: Secondary | ICD-10-CM | POA: Diagnosis not present

## 2021-10-05 DIAGNOSIS — M5136 Other intervertebral disc degeneration, lumbar region: Secondary | ICD-10-CM | POA: Diagnosis not present

## 2021-10-05 DIAGNOSIS — M62830 Muscle spasm of back: Secondary | ICD-10-CM | POA: Diagnosis not present

## 2021-10-05 DIAGNOSIS — M9903 Segmental and somatic dysfunction of lumbar region: Secondary | ICD-10-CM | POA: Diagnosis not present

## 2021-10-05 DIAGNOSIS — M9901 Segmental and somatic dysfunction of cervical region: Secondary | ICD-10-CM | POA: Diagnosis not present

## 2021-10-06 DIAGNOSIS — M160 Bilateral primary osteoarthritis of hip: Secondary | ICD-10-CM | POA: Diagnosis not present

## 2021-10-06 DIAGNOSIS — I10 Essential (primary) hypertension: Secondary | ICD-10-CM | POA: Diagnosis not present

## 2021-10-06 DIAGNOSIS — D582 Other hemoglobinopathies: Secondary | ICD-10-CM | POA: Diagnosis not present

## 2021-10-06 DIAGNOSIS — M19011 Primary osteoarthritis, right shoulder: Secondary | ICD-10-CM | POA: Diagnosis not present

## 2021-10-06 DIAGNOSIS — M19012 Primary osteoarthritis, left shoulder: Secondary | ICD-10-CM | POA: Diagnosis not present

## 2021-10-06 DIAGNOSIS — J449 Chronic obstructive pulmonary disease, unspecified: Secondary | ICD-10-CM | POA: Diagnosis not present

## 2021-10-07 IMAGING — DX DG CHEST 1V PORT
1 series · 1 of 1 positions shown · non-contrast
Comparison: March 30, 2020.

CLINICAL DATA: Shortness of breath.

EXAM:
PORTABLE CHEST 1 VIEW

[chest ap]
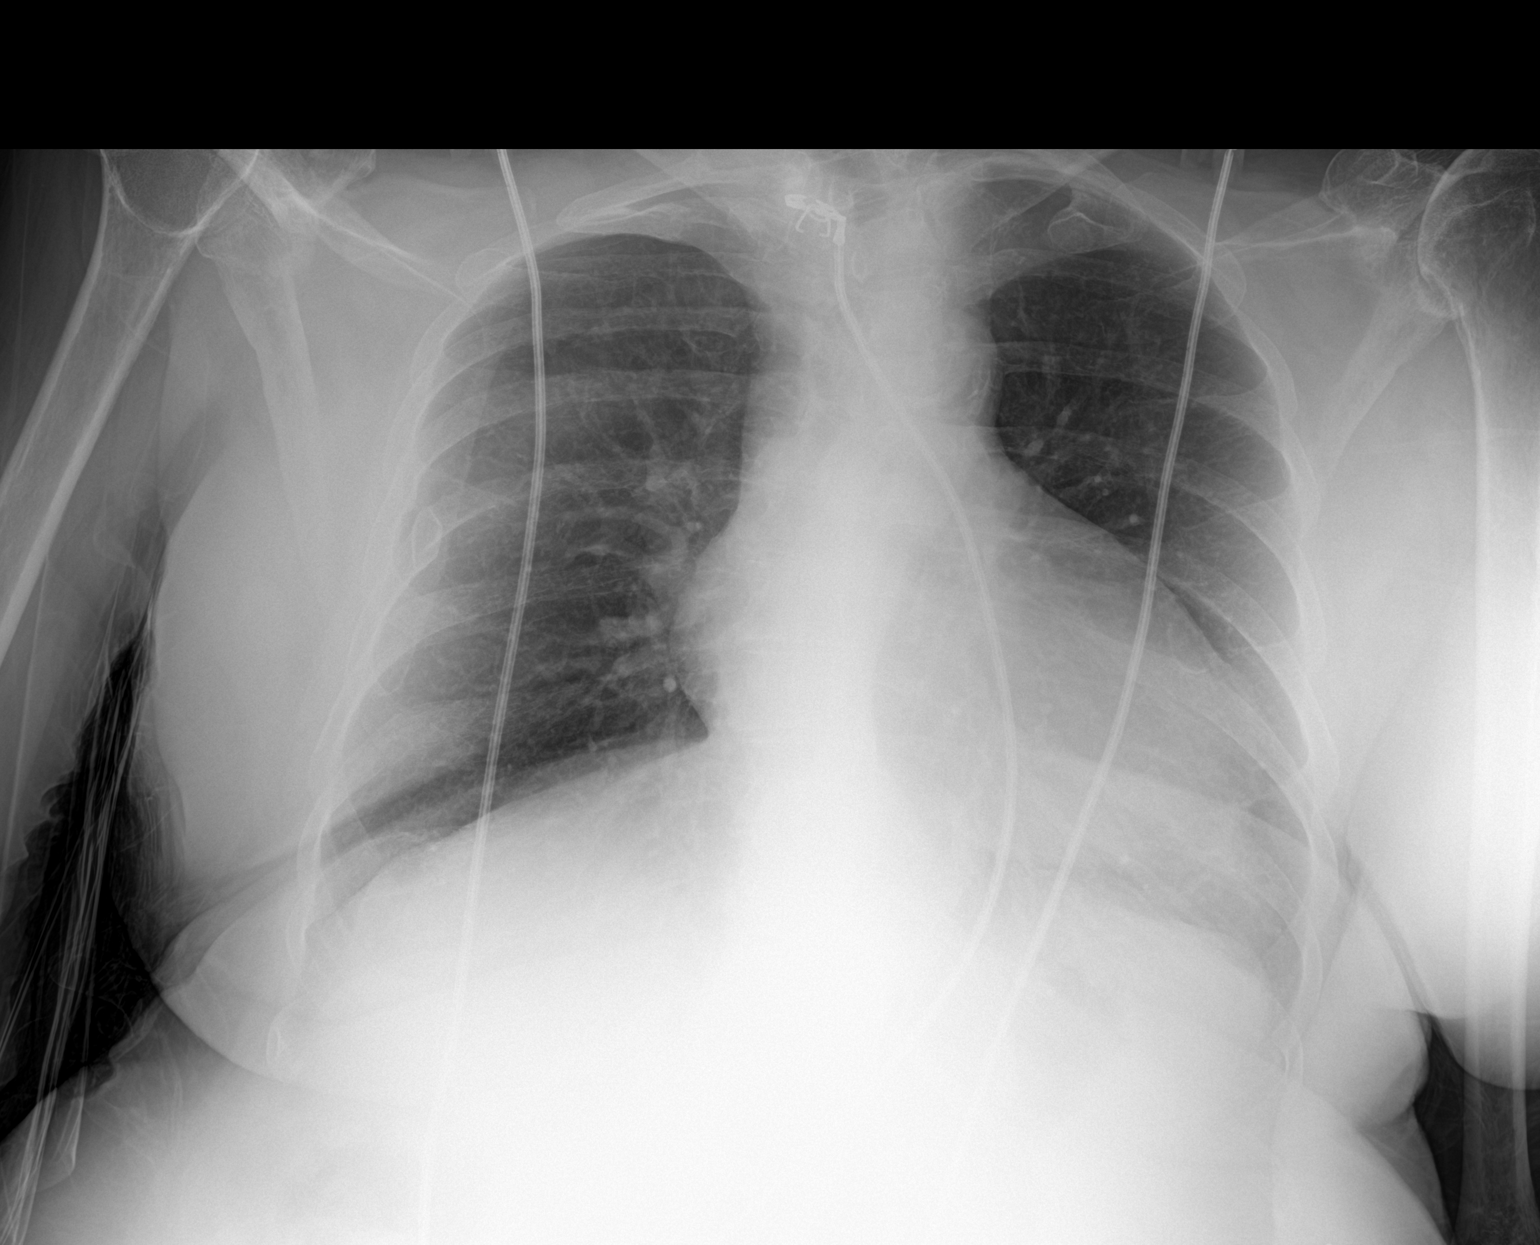

[1 of 1 positions shown; findings below may reference images not displayed]

FINDINGS: Stable cardiomediastinal silhouette. No pneumothorax or pleural
effusion is noted. Both lungs are clear. The visualized skeletal
structures are unremarkable.
IMPRESSION: No active disease.

## 2021-10-12 DIAGNOSIS — M5136 Other intervertebral disc degeneration, lumbar region: Secondary | ICD-10-CM | POA: Diagnosis not present

## 2021-10-12 DIAGNOSIS — M62830 Muscle spasm of back: Secondary | ICD-10-CM | POA: Diagnosis not present

## 2021-10-12 DIAGNOSIS — M9901 Segmental and somatic dysfunction of cervical region: Secondary | ICD-10-CM | POA: Diagnosis not present

## 2021-10-12 DIAGNOSIS — M9903 Segmental and somatic dysfunction of lumbar region: Secondary | ICD-10-CM | POA: Diagnosis not present

## 2021-10-14 ENCOUNTER — Other Ambulatory Visit: Payer: Self-pay

## 2021-10-14 ENCOUNTER — Ambulatory Visit: Payer: Medicare HMO | Admitting: Vascular Surgery

## 2021-10-14 ENCOUNTER — Encounter: Payer: Self-pay | Admitting: Vascular Surgery

## 2021-10-14 ENCOUNTER — Ambulatory Visit (HOSPITAL_COMMUNITY)
Admission: RE | Admit: 2021-10-14 | Discharge: 2021-10-14 | Disposition: A | Discharge status: Home / Self Care | Payer: Medicare HMO | Source: Ambulatory Visit | Attending: Vascular Surgery | Admitting: Vascular Surgery

## 2021-10-14 VITALS — BP 147/84 | HR 78 | Temp 98.4°F | Resp 20 | Ht 64.0 in | Wt 171.9 lb

## 2021-10-14 DIAGNOSIS — I700 Atherosclerosis of aorta: Secondary | ICD-10-CM | POA: Diagnosis not present

## 2021-10-14 DIAGNOSIS — I739 Peripheral vascular disease, unspecified: Secondary | ICD-10-CM | POA: Insufficient documentation

## 2021-10-14 NOTE — Progress Notes (Signed)
ASSESSMENT & PLAN   ULCERATED PLAQUE INFRARENAL AORTA: I think the abnormality seen on the CT of the abdomen on 08/11/2021 was simply ulcerated plaque.  I would agree with radiology that a 1 year follow-up CT scan is indicated and I have ordered that study.  I think it is unlikely that this will significantly change but if it does enlarge we would have to consider repair.  The aorta is heavily calcified which would certainly complicate things.  Fortunately I think it is unlikely that this will become a problem.  I will see her back in 1 year.  She knows to call sooner if she has problems.  REASON FOR CONSULT:    Possible penetrating ulcer of the infrarenal aorta.  The consult is requested by Dr. Link Snuffer.  HPI:   Jacqueline Ryan is a 76 y.o. female who was referred with an irregularity in the infrarenal aorta.  I reviewed the records from the referring office.  The patient was seen by Dr. Gloriann Loan on 08/10/2021 with hematuria.  This prompted a CT of the abdomen and pelvis. An incidental finding was an irregularity of the infrarenal abdominal aorta possibly consistent with a small penetrating ulcer versus an arthrosclerotic plaque.  The patient was sent for vascular consultation.  On my history, the patient denies any history of claudication, rest pain, or nonhealing ulcers.  She believes that her hematuria has resolved.  She is trying to schedule shoulder surgery on the left.  Past Medical History:  Diagnosis Date   Anxiety    Arthritis    Complication of anesthesia    SOB after surgery   Hepatitis    age 18   Hypertension     History reviewed. No pertinent family history.  SOCIAL HISTORY: Social History   Tobacco Use   Smoking status: Former    Packs/day: 1.00    Years: 15.00    Pack years: 15.00    Types: Cigarettes    Quit date: 03/29/2020    Years since quitting: 1.5   Smokeless tobacco: Never  Substance Use Topics   Alcohol use: Yes    Alcohol/week: 9.0 standard drinks     Types: 9 Cans of beer per week    Allergies  Allergen Reactions   Losartan Potassium     Other reaction(s): angioedema    Current Outpatient Medications  Medication Sig Dispense Refill   amLODipine (NORVASC) 5 MG tablet Take 5 mg by mouth daily.  3   Calcium Carbonate-Vit D-Min (CALCIUM 1200 PO) Take 1,200 mg by mouth daily.     Camphor-Menthol-Methyl Sal (SALONPAS) 3.10-15-08 % PTCH Apply 1 patch topically daily as needed (pain).     cholecalciferol (VITAMIN D3) 25 MCG (1000 UNIT) tablet Take 1,000 Units by mouth daily.     Coenzyme Q10 (COQ10 PO) Take 10 mLs by mouth daily.     Multiple Vitamins-Minerals (EMERGEN-C IMMUNE) PACK Take 1 Package by mouth daily.     Multiple Vitamins-Minerals (PRESERVISION AREDS 2) CAPS Take 2 capsules by mouth daily.     Omega-3 Fatty Acids (FISH OIL) 1000 MG CAPS Take 2,000 mg by mouth daily.     Spacer/Aero-Holding Chambers (E-Z SPACER) inhaler Use as instructed 1 each 2   TURMERIC PO Take 15 mLs by mouth daily.     vitamin B-12 (CYANOCOBALAMIN) 1000 MCG tablet Take 1,000 mcg by mouth daily.     vitamin C (ASCORBIC ACID) 500 MG tablet Take 500 mg by mouth daily.     aspirin  325 MG tablet Take 325 mg by mouth daily as needed for moderate pain. (Patient not taking: Reported on 10/14/2021)     No current facility-administered medications for this visit.    REVIEW OF SYSTEMS:  [X]  denotes positive finding, [ ]  denotes negative finding Cardiac  Comments:  Chest pain or chest pressure:    Shortness of breath upon exertion:    Short of breath when lying flat:    Irregular heart rhythm:        Vascular    Pain in calf, thigh, or hip brought on by ambulation:    Pain in feet at night that wakes you up from your sleep:     Blood clot in your veins:    Leg swelling:         Pulmonary    Oxygen at home:    Productive cough:     Wheezing:         Neurologic    Sudden weakness in arms or legs:     Sudden numbness in arms or legs:     Sudden onset  of difficulty speaking or slurred speech:    Temporary loss of vision in one eye:     Problems with dizziness:         Gastrointestinal    Blood in stool:     Vomited blood:         Genitourinary    Burning when urinating:     Blood in urine:        Psychiatric    Major depression:         Hematologic    Bleeding problems:    Problems with blood clotting too easily:        Skin    Rashes or ulcers:        Constitutional    Fever or chills:    -  PHYSICAL EXAM:   Vitals:   10/14/21 1536  BP: (!) 147/84  Pulse: 78  Resp: 20  Temp: 98.4 F (36.9 C)  SpO2: 98%  Weight: 171 lb 14.4 oz (78 kg)  Height: 5\' 4"  (1.626 m)   Body mass index is 29.51 kg/m. GENERAL: The patient is a well-nourished female, in no acute distress. The vital signs are documented above. CARDIAC: There is a regular rate and rhythm.  VASCULAR: I do not detect carotid bruits. Both feet are warm and well-perfused. PULMONARY: There is good air exchange bilaterally without wheezing or rales. ABDOMEN: Soft and non-tender with normal pitched bowel sounds.  MUSCULOSKELETAL: There are no major deformities. NEUROLOGIC: No focal weakness or paresthesias are detected. SKIN: There are no ulcers or rashes noted. PSYCHIATRIC: The patient has a normal affect.  DATA:    I did review the CT of the abdomen and pelvis that was done on 08/11/2021.  This was done to work-up hematuria.  Radiology interpreted this as showing no acute findings in the abdomen or pelvis.  There was a moderate to large hiatal hernia.  There was an irregularity in the lateral wall of the infrarenal aorta possibly consistent with a penetrating ulcer or an ulcerated plaque.  On my review of the films I think it looks more like an ulcerated plaque.  Follow-up CT scan in 12 months was recommended.      ARTERIAL DOPPLER STUDY: I have independently interpreted her arterial Doppler study today.  On the right side there is a biphasic dorsalis  pedis signal with a triphasic posterior tibial signal.  ABIs 100%.  Toe pressures 113 mmHg.  On the left side there is a biphasic dorsalis pedis and posterior tibial signal.  ABIs 98%.  Toe pressures 119 mmHg.   Deitra Mayo [facility]

## 2021-10-21 DIAGNOSIS — M62830 Muscle spasm of back: Secondary | ICD-10-CM | POA: Diagnosis not present

## 2021-10-21 DIAGNOSIS — M5136 Other intervertebral disc degeneration, lumbar region: Secondary | ICD-10-CM | POA: Diagnosis not present

## 2021-10-21 DIAGNOSIS — M9903 Segmental and somatic dysfunction of lumbar region: Secondary | ICD-10-CM | POA: Diagnosis not present

## 2021-10-21 DIAGNOSIS — M9901 Segmental and somatic dysfunction of cervical region: Secondary | ICD-10-CM | POA: Diagnosis not present

## 2021-10-22 NOTE — Progress Notes (Signed)
Surgery orders requested via Epic inbox. °

## 2021-10-22 NOTE — Patient Instructions (Addendum)
DUE TO COVID-19 ONLY ONE VISITOR IS ALLOWED TO COME WITH YOU AND STAY IN THE WAITING ROOM ONLY DURING PRE OP AND PROCEDURE DAY OF SURGERY IF YOU ARE GOING HOME AFTER SURGERY. IF YOU ARE SPENDING THE NIGHT 2 PEOPLE MAY VISIT WITH YOU IN YOUR PRIVATE ROOM AFTER SURGERY UNTIL VISITING  HOURS ARE OVER AT 800 PM AND 1  VISITOR  MAY  SPEND THE NIGHT.   YOU NEED TO HAVE A COVID 19 TEST ON__2/1/23_____ @_9 :15______, THIS TEST MUST BE DONE BEFORE SURGERY,                  Jacqueline Ryan     Your procedure is scheduled on: 11/12/21   Report to [facility] Main  Entrance   Report to admitting at 7:00 AM     Call this number if you have problems the morning of surgery [phone]    No food after midnight.    You may have clear liquid until 5:45 AM.    At 5:15 AM drink pre surgery drink. Clear ensure drink  Nothing by mouth after 5:45 AM.       BRUSH YOUR TEETH MORNING OF SURGERY AND RINSE YOUR MOUTH OUT, NO CHEWING GUM [place] OR [place].     Take these medicines the morning of surgery with A SIP OF WATER: Amlodipine                                You may not have any metal on your body including hair pins and              piercings  Do not wear jewelry, make-up, lotions, powders or perfumes, deodorant             Do not wear nail polish on your fingernails.  Do not shave  48 hours prior to surgery.                 Do not bring valuables to the hospital. [name].  Contacts, dentures or bridgework may not be worn into surgery.  Leave suitcase in the car. After surgery it may be brought to your room.     Patients discharged the day of surgery will not be allowed to drive home. IF YOU ARE HAVING SURGERY AND GOING HOME THE SAME DAY, YOU MUST HAVE AN ADULT TO DRIVE YOU HOME AND BE WITH YOU FOR 24 HOURS. YOU MAY GO HOME BY TAXI OR UBER OR ORTHERWISE, BUT AN ADULT MUST ACCOMPANY YOU HOME AND STAY WITH YOU FOR 24 HOURS.  Name and phone  number of your driver:  Special Instructions: N/A              Please read over the following fact sheets you were given: _____________________________________________________________________  [facility]- Preparing for Total Shoulder Arthroplasty    Before surgery, you can play an important role. Because skin is not sterile, your skin needs to be as free of germs as possible. You can reduce the number of germs on your skin by using the following products. Benzoyl Peroxide Gel Reduces the number of germs present on the skin Applied twice a day to shoulder area starting two days before surgery    ==================================================================  Please follow these instructions carefully:  BENZOYL PEROXIDE 5% GEL  Please do not use if  you have an allergy to benzoyl peroxide.   If your skin becomes reddened/irritated stop using the benzoyl peroxide.  Starting two days before surgery, apply as follows: Apply benzoyl peroxide in the morning and at night. Apply after taking a shower. If you are not taking a shower clean entire shoulder front, back, and side along with the armpit with a clean wet washcloth.  Place a quarter-sized dollop on your shoulder and rub in thoroughly, making sure to cover the front, back, and side of your shoulder, along with the armpit.   2 days before ____ AM   ____ PM              1 day before ____ AM   ____ PM                         Do this twice a day for two days.  (Last application is the night before surgery, AFTER using the CHG soap as described below).  Do NOT apply benzoyl peroxide gel on the day of surgery.            [place] - Preparing for Surgery Before surgery, you can play an important role.  Because skin is not sterile, your skin needs to be as free of germs as possible.  You can reduce the number of germs on your skin by washing with CHG (chlorahexidine gluconate) soap before surgery.  CHG is an antiseptic cleaner which  kills germs and bonds with the skin to continue killing germs even after washing. Please DO NOT use if you have an allergy to CHG or antibacterial soaps.  If your skin becomes reddened/irritated stop using the CHG and inform your nurse when you arrive at Short Stay. Do not shave (including legs and underarms) for at least 48 hours prior to the first CHG shower.   Please follow these instructions carefully:  1.  Shower with CHG Soap the night before surgery and the  morning of Surgery.  2.  If you choose to wash your hair, wash your hair first as usual with your  normal  shampoo.  3.  After you shampoo, rinse your hair and body thoroughly to remove the  shampoo.                            4.  Use CHG as you would any other liquid soap.  You can apply chg directly  to the skin and wash                       Gently with a scrungie or clean washcloth.  5.  Apply the CHG Soap to your body ONLY FROM THE NECK DOWN.   Do not use on face/ open                           Wound or open sores. Avoid contact with eyes, ears mouth and genitals (private parts).                       Wash face,  Genitals (private parts) with your normal soap.             6.  Wash thoroughly, paying special attention to the area where your surgery  will be performed.  7.  Thoroughly rinse your body with warm water from the  neck down.  8.  DO NOT shower/wash with your normal soap after using and rinsing off  the CHG Soap.                9.  Pat yourself dry with a clean towel.            10.  Wear clean pajamas.            11.  Place clean sheets on your bed the night of your first shower and do not  sleep with pets. Day of Surgery : Do not apply any lotions/deodorants the morning of surgery.  Please wear clean clothes to the hospital/surgery center.  FAILURE TO FOLLOW THESE INSTRUCTIONS MAY RESULT IN THE CANCELLATION OF YOUR SURGERY PATIENT SIGNATURE_________________________________  NURSE  SIGNATURE__________________________________  ________________________________________________________________________ pcr

## 2021-10-26 DIAGNOSIS — M9903 Segmental and somatic dysfunction of lumbar region: Secondary | ICD-10-CM | POA: Diagnosis not present

## 2021-10-26 DIAGNOSIS — M5136 Other intervertebral disc degeneration, lumbar region: Secondary | ICD-10-CM | POA: Diagnosis not present

## 2021-10-26 DIAGNOSIS — M62830 Muscle spasm of back: Secondary | ICD-10-CM | POA: Diagnosis not present

## 2021-10-26 DIAGNOSIS — M9901 Segmental and somatic dysfunction of cervical region: Secondary | ICD-10-CM | POA: Diagnosis not present

## 2021-10-26 NOTE — H&P (Signed)
Patient's anticipated LOS is less than 2 midnights, meeting these requirements: - Younger than 51 - Lives within 1 hour of care - Has a competent adult at home to recover with post-op recover - NO history of  - Chronic pain requiring opiods  - Diabetes  - Coronary Artery Disease  - Heart failure  - Heart attack  - Stroke  - DVT/VTE  - Cardiac arrhythmia  - Respiratory Failure/COPD  - Renal failure  - Anemia  - Advanced Liver disease     Jacqueline Ryan is an 76 y.o. female.    Chief Complaint: left shoulder pain  HPI: Pt is a 76 y.o. female complaining of left shoulder pain for multiple years. Pain had continually increased since the beginning. X-rays in the clinic show end-stage arthritic changes of the left shoulder. Pt has tried various conservative treatments which have failed to alleviate their symptoms, including injections and therapy. Various options are discussed with the patient. Risks, benefits and expectations were discussed with the patient. Patient understand the risks, benefits and expectations and wishes to proceed with surgery.   PCP:  [facility], [place]  D/C Plans: Home  PMH: Past Medical History:  Diagnosis Date   Anxiety    Arthritis    Complication of anesthesia    SOB after surgery   Hepatitis    age 54   Hypertension     PSH: Past Surgical History:  Procedure Laterality Date   ABDOMINAL HYSTERECTOMY     EYE SURGERY     TONSILLECTOMY     TOTAL HIP ARTHROPLASTY Left 07/23/2019   Procedure: TOTAL HIP ARTHROPLASTY ANTERIOR APPROACH;  Surgeon: Paralee Cancel, MD;  Location: [facility];  Service: Orthopedics;  Laterality: Left;  70 mins    Social History:  reports that she quit smoking about 18 months ago. Her smoking use included cigarettes. She has a 15.00 pack-year smoking history. She has never used smokeless tobacco. She reports current alcohol use of about 9.0 standard drinks per week. She reports that she does not use  drugs.  Allergies:  Allergies  Allergen Reactions   Losartan Potassium     angioedema    Medications: No current facility-administered medications for this encounter.   Current Outpatient Medications  Medication Sig Dispense Refill   amLODipine (NORVASC) 10 MG tablet Take 10 mg by mouth daily.  3   aspirin 325 MG tablet Take 650 mg by mouth daily as needed for moderate pain.     B Complex-C (SUPER B COMPLEX PO) Take 1 capsule by mouth daily.     Camphor-Menthol-Methyl Sal (SALONPAS) 3.10-15-08 % PTCH Apply 1 patch topically daily as needed (pain).     Cholecalciferol (VITAMIN D) 50 MCG (2000 UT) CAPS Take 2,000 Units by mouth daily.     diclofenac Sodium (VOLTAREN) 1 % GEL Apply 1 application topically 2 (two) times daily as needed (pain).     Histamine Dihydrochloride (AUSTRALIAN DREAM ARTHRITIS) 0.025 % CREA Apply 1 application topically daily as needed (pain).     hydrochlorothiazide (HYDRODIURIL) 25 MG tablet Take 25 mg by mouth daily.     Multiple Vitamins-Minerals (PRESERVISION AREDS 2) CAPS Take 2 capsules by mouth daily.     Omega-3 Fatty Acids (FISH OIL) 1000 MG CAPS Take 2,000 mg by mouth daily.     vitamin B-12 (CYANOCOBALAMIN) 1000 MCG tablet Take 1,000 mcg by mouth daily.      No results found for this or any previous visit (from the past 48 hour(s)).  No results found.  ROS: Pain with rom of the left upper extremity  Physical Exam: Alert and oriented 76 y.o. female in no acute distress Cranial nerves 2-12 intact Cervical spine: full rom with no tenderness, nv intact distally Chest: active breath sounds bilaterally, no wheeze rhonchi or rales Heart: regular rate and rhythm, no murmur Abd: non tender non distended with active bowel sounds Hip is stable with rom  Left shoulder painful and weak rom Nv intact distally No rashes or edema distally  Assessment/Plan Assessment: left shoulder cuff arthropathy  Plan:  Patient will undergo a left reverse total  shoulder by Dr. Veverly Fells at [place] Risks benefits and expectations were discussed with the patient. Patient understand risks, benefits and expectations and wishes to proceed. Preoperative templating of the joint replacement has been completed, documented, and submitted to the Operating Room personnel in order to optimize intra-operative equipment management.   [name] PA-C, MPAS [facility] is now [employer] [address] [place], [place],  [address] Phone: [phone] [email or web address] Facebook   [employer]

## 2021-10-31 DIAGNOSIS — K05219 Aggressive periodontitis, localized, unspecified severity: Secondary | ICD-10-CM | POA: Diagnosis not present

## 2021-11-01 ENCOUNTER — Encounter (HOSPITAL_COMMUNITY): Payer: Self-pay

## 2021-11-01 ENCOUNTER — Other Ambulatory Visit: Payer: Self-pay

## 2021-11-01 ENCOUNTER — Encounter (HOSPITAL_COMMUNITY)
Admission: RE | Admit: 2021-11-01 | Discharge: 2021-11-01 | Disposition: A | Discharge status: Home / Self Care | Payer: Medicare HMO | Source: Ambulatory Visit | Attending: Orthopedic Surgery | Admitting: Orthopedic Surgery

## 2021-11-01 VITALS — BP 149/76 | HR 99 | Temp 97.9°F | Resp 18 | Ht 64.0 in | Wt 167.0 lb

## 2021-11-01 DIAGNOSIS — Z01818 Encounter for other preprocedural examination: Secondary | ICD-10-CM | POA: Diagnosis not present

## 2021-11-01 DIAGNOSIS — R599 Enlarged lymph nodes, unspecified: Secondary | ICD-10-CM | POA: Diagnosis not present

## 2021-11-01 LAB — BASIC METABOLIC PANEL
Anion gap: 8 (ref 5–15)
BUN: 11 mg/dL (ref 8–23)
CO2: 30 mmol/L (ref 22–32)
Calcium: 9.2 mg/dL (ref 8.9–10.3)
Chloride: 95 mmol/L — ABNORMAL LOW (ref 98–111)
Creatinine, Ser: 0.42 mg/dL — ABNORMAL LOW (ref 0.44–1.00)
GFR, Estimated: >60 mL/min (ref >60)
Glucose, Bld: 101 mg/dL — ABNORMAL HIGH (ref 70–99)
Potassium: 3.7 mmol/L (ref 3.5–5.1)
Sodium: 133 mmol/L — ABNORMAL LOW (ref 135–145)

## 2021-11-01 LAB — CBC
HCT: 42.9 % (ref 36.0–46.0)
Hemoglobin: 14.1 g/dL (ref 12.0–15.0)
MCH: 28.5 pg (ref 26.0–34.0)
MCHC: 32.9 g/dL (ref 30.0–36.0)
MCV: 86.7 fL (ref 80.0–100.0)
Platelets: 367 10*3/uL (ref 150–400)
RBC: 4.95 MIL/uL (ref 3.87–5.11)
RDW: 14.3 % (ref 11.5–15.5)
WBC: 7.5 10*3/uL (ref 4.0–10.5)
nRBC: 0 % (ref 0.0–0.2)

## 2021-11-01 LAB — SURGICAL PCR SCREEN
MRSA, PCR: NEGATIVE
Staphylococcus aureus: NEGATIVE

## 2021-11-01 NOTE — Progress Notes (Addendum)
COVID test- 11/10/21 at 9:15   PCP - [facility] family practice Cardiologist - none  Chest x-ray - 10/25/20-epic EKG - 11/01/21-chart Stress Test - no ECHO - no Cardiac Cath - no Pacemaker/ICD device last checked:NA  Sleep Study - NA CPAP -   Fasting Blood Sugar - NA Checks Blood Sugar _____ times a day  Blood Thinner Instructions:NA Aspirin Instructions: Last Dose:  Anesthesia review: no  Patient denies shortness of breath, fever, cough and chest pain at PAT appointment Pt has no SOB climbing stairs, doing housework or with ADLs. She sees a chiropractor for a compressed disc in neck and lower back.  Patient verbalized understanding of instructions that were given to them at the PAT appointment. Patient was also instructed that they will need to review over the PAT instructions again at home before surgery. yes

## 2021-11-02 DIAGNOSIS — M9901 Segmental and somatic dysfunction of cervical region: Secondary | ICD-10-CM | POA: Diagnosis not present

## 2021-11-02 DIAGNOSIS — M62830 Muscle spasm of back: Secondary | ICD-10-CM | POA: Diagnosis not present

## 2021-11-02 DIAGNOSIS — M9903 Segmental and somatic dysfunction of lumbar region: Secondary | ICD-10-CM | POA: Diagnosis not present

## 2021-11-02 DIAGNOSIS — M5136 Other intervertebral disc degeneration, lumbar region: Secondary | ICD-10-CM | POA: Diagnosis not present

## 2021-11-09 DIAGNOSIS — M62830 Muscle spasm of back: Secondary | ICD-10-CM | POA: Diagnosis not present

## 2021-11-09 DIAGNOSIS — M5136 Other intervertebral disc degeneration, lumbar region: Secondary | ICD-10-CM | POA: Diagnosis not present

## 2021-11-09 DIAGNOSIS — M9903 Segmental and somatic dysfunction of lumbar region: Secondary | ICD-10-CM | POA: Diagnosis not present

## 2021-11-09 DIAGNOSIS — M9901 Segmental and somatic dysfunction of cervical region: Secondary | ICD-10-CM | POA: Diagnosis not present

## 2021-11-10 ENCOUNTER — Encounter (HOSPITAL_COMMUNITY): Payer: Medicare HMO

## 2021-11-12 ENCOUNTER — Ambulatory Visit (HOSPITAL_COMMUNITY): Admission: RE | Admit: 2021-11-12 | Payer: Medicare HMO | Source: Home / Self Care | Admitting: Orthopedic Surgery

## 2021-11-12 ENCOUNTER — Encounter (HOSPITAL_COMMUNITY): Admission: RE | Payer: Self-pay | Source: Home / Self Care

## 2021-11-12 SURGERY — ARTHROPLASTY, SHOULDER, TOTAL, REVERSE
Anesthesia: General | Site: Shoulder | Laterality: Left

## 2021-11-16 DIAGNOSIS — M62830 Muscle spasm of back: Secondary | ICD-10-CM | POA: Diagnosis not present

## 2021-11-16 DIAGNOSIS — M9903 Segmental and somatic dysfunction of lumbar region: Secondary | ICD-10-CM | POA: Diagnosis not present

## 2021-11-16 DIAGNOSIS — M9901 Segmental and somatic dysfunction of cervical region: Secondary | ICD-10-CM | POA: Diagnosis not present

## 2021-11-16 DIAGNOSIS — M5136 Other intervertebral disc degeneration, lumbar region: Secondary | ICD-10-CM | POA: Diagnosis not present

## 2021-11-23 DIAGNOSIS — M9901 Segmental and somatic dysfunction of cervical region: Secondary | ICD-10-CM | POA: Diagnosis not present

## 2021-11-23 DIAGNOSIS — M25512 Pain in left shoulder: Secondary | ICD-10-CM | POA: Diagnosis not present

## 2021-11-23 DIAGNOSIS — M62830 Muscle spasm of back: Secondary | ICD-10-CM | POA: Diagnosis not present

## 2021-11-23 DIAGNOSIS — M9903 Segmental and somatic dysfunction of lumbar region: Secondary | ICD-10-CM | POA: Diagnosis not present

## 2021-11-23 DIAGNOSIS — M5136 Other intervertebral disc degeneration, lumbar region: Secondary | ICD-10-CM | POA: Diagnosis not present

## 2021-11-30 DIAGNOSIS — M62830 Muscle spasm of back: Secondary | ICD-10-CM | POA: Diagnosis not present

## 2021-11-30 DIAGNOSIS — M9903 Segmental and somatic dysfunction of lumbar region: Secondary | ICD-10-CM | POA: Diagnosis not present

## 2021-11-30 DIAGNOSIS — M5136 Other intervertebral disc degeneration, lumbar region: Secondary | ICD-10-CM | POA: Diagnosis not present

## 2021-11-30 DIAGNOSIS — M9901 Segmental and somatic dysfunction of cervical region: Secondary | ICD-10-CM | POA: Diagnosis not present

## 2021-12-09 DIAGNOSIS — I10 Essential (primary) hypertension: Secondary | ICD-10-CM | POA: Diagnosis not present

## 2021-12-13 NOTE — Progress Notes (Signed)
Surgery orders requested via Epic inbox. °

## 2021-12-14 NOTE — H&P (Signed)
?Patient's anticipated LOS is less than 2 midnights, meeting these requirements: ?- Younger than 14 ?- Lives within 1 hour of care ?- Has a competent adult at home to recover with post-op recover ?- NO history of ? - Chronic pain requiring opiods ? - Diabetes ? - Coronary Artery Disease ? - Heart failure ? - Heart attack ? - Stroke ? - DVT/VTE ? - Cardiac arrhythmia ? - Respiratory Failure/COPD ? - Renal failure ? - Anemia ? - Advanced Liver disease ? ?  ? ?Jacqueline Ryan is an 76 y.o. female.   ? ?Chief Complaint: left shoulder pain ? ?HPI: Pt is a 76 y.o. female complaining of left shoulder pain for multiple years. Pain had continually increased since the beginning. X-rays in the clinic show end-stage arthritic changes of the left shoulder. Pt has tried various conservative treatments which have failed to alleviate their symptoms, including injections and therapy. Various options are discussed with the patient. Risks, benefits and expectations were discussed with the patient. Patient understand the risks, benefits and expectations and wishes to proceed with surgery.  ? ?PCP:  [facility], [place] ? ?D/C Plans: Home ? ?PMH: ?Past Medical History:  ?Diagnosis Date  ? Anxiety   ? Arthritis   ? Complication of anesthesia   ? SOB after surgery  ? Hepatitis   ? age 61  ? Hypertension   ? ? ?PSH: ?Past Surgical History:  ?Procedure Laterality Date  ? ABDOMINAL HYSTERECTOMY    ? age 27  ? EYE SURGERY Bilateral   ? TONSILLECTOMY    ? as a child  ? TOTAL HIP ARTHROPLASTY Left 07/23/2019  ? Procedure: TOTAL HIP ARTHROPLASTY ANTERIOR APPROACH;  Surgeon: Paralee Cancel, MD;  Location: [facility];  Service: Orthopedics;  Laterality: Left;  70 mins  ? ? ?Social History:  reports that she quit smoking about 20 months ago. Her smoking use included cigarettes. She has a 15.00 pack-year smoking history. She has never used smokeless tobacco. She reports current alcohol use of about 9.0 standard drinks per week. She reports  that she does not use drugs. ? ?Allergies:  ?Allergies  ?Allergen Reactions  ? Losartan Potassium   ?  angioedema  ? ? ?Medications: ?No current facility-administered medications for this encounter.  ? ?Current Outpatient Medications  ?Medication Sig Dispense Refill  ? amLODipine (NORVASC) 10 MG tablet Take 10 mg by mouth daily.  3  ? aspirin 325 MG tablet Take 650 mg by mouth daily as needed for moderate pain.    ? B Complex-C (SUPER B COMPLEX PO) Take 1 capsule by mouth daily.    ? Camphor-Menthol-Methyl Sal (SALONPAS) 3.10-15-08 % PTCH Apply 1 patch topically daily as needed (pain).    ? Cholecalciferol (VITAMIN D) 50 MCG (2000 UT) CAPS Take 2,000 Units by mouth daily.    ? diclofenac Sodium (VOLTAREN) 1 % GEL Apply 1 application topically 2 (two) times daily as needed (pain).    ? Histamine Dihydrochloride (AUSTRALIAN DREAM ARTHRITIS) 0.025 % CREA Apply 1 application topically daily as needed (pain).    ? hydrochlorothiazide (HYDRODIURIL) 25 MG tablet Take 25 mg by mouth daily.    ? Multiple Vitamins-Minerals (PRESERVISION AREDS 2) CAPS Take 2 capsules by mouth daily.    ? Omega-3 Fatty Acids (FISH OIL) 1000 MG CAPS Take 2,000 mg by mouth daily.    ? vitamin B-12 (CYANOCOBALAMIN) 1000 MCG tablet Take 1,000 mcg by mouth daily.    ? ? ?No results found for this  or any previous visit (from the past 48 hour(s)). ?No results found. ? ?ROS: ?Pain with rom of the left upper extremity ? ?Physical Exam: ?Alert and oriented 76 y.o. female in no acute distress ?Cranial nerves 2-12 intact ?Cervical spine: full rom with no tenderness, nv intact distally ?Chest: active breath sounds bilaterally, no wheeze rhonchi or rales ?Heart: regular rate and rhythm, no murmur ?Abd: non tender non distended with active bowel sounds ?Hip is stable with rom  ?Left shoulder painful and weak rom ?Nv intact distally ?No rashes or edema distally ? ?Assessment/Plan ?Assessment: left shoulder cuff arthropathy ? ?Plan: ? ?Patient will undergo a left  reverse total shoulder by Dr. Veverly Fells at [place] Risks benefits and expectations were discussed with the patient. Patient understand risks, benefits and expectations and wishes to proceed. ?Preoperative templating of the joint replacement has been completed, documented, and submitted to the Operating Room personnel in order to optimize intra-operative equipment management.  ? ?[name] PA-C, MPAS ?[facility] is now [employer]  [facility] ?[address], [place], [place] [address] ?Phone: [phone] ?[email or web address] ?Facebook  Engineer, structural  ? ?  ? ?

## 2021-12-21 DIAGNOSIS — M9901 Segmental and somatic dysfunction of cervical region: Secondary | ICD-10-CM | POA: Diagnosis not present

## 2021-12-21 DIAGNOSIS — M9903 Segmental and somatic dysfunction of lumbar region: Secondary | ICD-10-CM | POA: Diagnosis not present

## 2021-12-21 DIAGNOSIS — M62830 Muscle spasm of back: Secondary | ICD-10-CM | POA: Diagnosis not present

## 2021-12-21 DIAGNOSIS — M5136 Other intervertebral disc degeneration, lumbar region: Secondary | ICD-10-CM | POA: Diagnosis not present

## 2021-12-21 NOTE — Progress Notes (Addendum)
Anesthesia Review: ? ?PCP: [facility]  ?Cardiologist : none  ?Dr Dannielle Karvonen- 10/14/21- seen for something seen on CT scan by DR Link Snuffer- followup recommended for one year.   ?Chest x-ray : ?EKG : 11/01/21  ?10/14/21- DR Scot Dock  ?Echo : ?Stress test: ?Cardiac Cath :  ?Activity level: can do a flight of stairs without difficulty  ?Sleep Study/ CPAP : none  ?Fasting Blood Sugar :      / Checks Blood Sugar -- times a day:   ?Blood Thinner/ Instructions /Last Dose: ?ASA / Instructions/ Last Dose :  ?325 mg aspirin prn  - Instructed pt at preop appt to call surgeon office and be advised in regards to aspirin preop.  Pt voiced understanding.   ?Also instructed pt that if she develops any s/s of covid she needs to notify surgeon and pcp and be tested prior to surgery PT voiced understanding.  ?Pt given joint booklet.  PT stated she still had questions for MD.  PT instructed to call officed and to speak with surgery scheduler and to let them know she has questions for surgeon tht need to bve answered.   ?PT with hx of acute respiratiory failure 04/03/20 at [facility].  06/03/22- Seen by Pulmonary DR Dionne Milo. No longer followed by pulmonary per pt.   ?This surgery has been cancelled x 3.   ?PT stated at preop she was not going to remove fingernail polish.  Instructed pt to at least remove polish form one nail on each hand.  PT stated she was only going to do one shower the nite before surgery and none day of surgery,  Explained to pt the purpsoe of removing nail polish but to at least removed from one finger of each hand.  Informed pt the importrance of hibiclens shower.  Informed pt that if she did not take hibiclens shower the am of surgeery on arrival at hospital she would be wiped down with hibiclens wipes prior to surgery.   ?

## 2021-12-21 NOTE — Progress Notes (Addendum)
DUE TO COVID-19 ONLY ONE VISITOR IS ALLOWED TO COME WITH YOU AND STAY IN THE WAITING ROOM ONLY DURING PRE OP AND PROCEDURE DAY OF SURGERY.  2 VISITOR  MAY VISIT WITH YOU AFTER SURGERY IN YOUR PRIVATE ROOM DURING VISITING HOURS ONLY! ?YOU MAY HAVE ONE PERSON SPEND THE NITE WITH YOU IN YOUR ROOM AFTER SURGERY.   ? ?Y Your procedure is scheduled on:  ?  12/31/21  ? Report to [facility] Main  Entrance ? ? Report to admitting at         0515         AM ?DO NOT [place], PICTURE ID OR WALLET DAY OF SURGERY.  ?  ? ? Call this number if you have problems the morning of surgery [phone]  ? ? REMEMBER: NO  SOLID FOODS , CANDY, GUM OR MINTS AFTER MIDNITE THE NITE BEFORE SURGERY .       Marland Kitchen CLEAR LIQUIDS UNTIL   0430 am             DAY OF SURGERY.      PLEASE FINISH ENSURE DRINK PER SURGEON ORDER  WHICH NEEDS TO BE COMPLETED AT     0430am     MORNING OF SURGERY.   ? ? ? ? ?CLEAR LIQUID DIET ? ? ?Foods Allowed      ?WATER ?BLACK COFFEE ( SUGAR OK, NO MILK, CREAM OR CREAMER) REGULAR AND DECAF  ?TEA ( SUGAR OK NO MILK, CREAM, OR CREAMER) REGULAR AND DECAF  ?PLAIN JELLO ( NO RED)  ?FRUIT ICES ( NO RED, NO FRUIT PULP)  ?POPSICLES ( NO RED)  ?JUICE- APPLE, WHITE GRAPE AND WHITE CRANBERRY  ?SPORT DRINK LIKE GATORADE ( NO RED)  ?CLEAR BROTH ( VEGETABLE , CHICKEN OR BEEF)                                                               ? ?    ? ?BRUSH YOUR TEETH MORNING OF SURGERY AND RINSE YOUR MOUTH OUT, NO CHEWING GUM CANDY OR MINTS. ?  ? ? Take these medicines the morning of surgery with A SIP OF WATER:  none  ? ? ?DO NOT TAKE ANY DIABETIC MEDICATIONS DAY OF YOUR SURGERY ?                  ?            You may not have any metal on your body including hair pins and  ?            piercings  Do not wear jewelry, make-up, lotions, powders or perfumes, deodorant ?            Do not wear nail polish on your fingernails.   ?           IF YOU ARE A FEMALE AND WANT TO SHAVE UNDER ARMS OR LEGS PRIOR TO SURGERY YOU MUST DO  SO AT LEAST 48 HOURS PRIOR TO SURGERY.  ?            Men may shave face and neck. ? ? Do not bring valuables to the hospital. [facility] NOT ?            RESPONSIBLE   FOR VALUABLES. ?  Contacts, dentures or bridgework may not be worn into surgery. ? Leave suitcase in the car. After surgery it may be brought to your room. ? ?  ? Patients discharged the day of surgery will not be allowed to drive home. IF YOU ARE HAVING SURGERY AND GOING HOME THE SAME DAY, YOU MUST HAVE AN ADULT TO DRIVE YOU HOME AND BE WITH YOU FOR 24 HOURS. YOU MAY GO HOME BY TAXI OR UBER OR ORTHERWISE, BUT AN ADULT MUST ACCOMPANY YOU HOME AND STAY WITH YOU FOR 24 HOURS. ?  ? ?            Please read over the following fact sheets you were given: ?_____________________________________________________________________ ? ?[facility] - Preparing for Surgery ?Before surgery, you can play an important role.  Because skin is not sterile, your skin needs to be as free of germs as possible.  You can reduce the number of germs on your skin by washing with CHG (chlorahexidine gluconate) soap before surgery.  CHG is an antiseptic cleaner which kills germs and bonds with the skin to continue killing germs even after washing. ?Please DO NOT use if you have an allergy to CHG or antibacterial soaps.  If your skin becomes reddened/irritated stop using the CHG and inform your nurse when you arrive at Short Stay. ?Do not shave (including legs and underarms) for at least 48 hours prior to the first CHG shower.  You may shave your face/neck. ?Please follow these instructions carefully: ? 1.  Shower with CHG Soap the night before surgery and the  morning of Surgery. ? 2.  If you choose to wash your hair, wash your hair first as usual with your  normal  shampoo. ? 3.  After you shampoo, rinse your hair and body thoroughly to remove the  shampoo.                           4.  Use CHG as you would any other liquid soap.  You can apply chg directly  to the skin and wash   ?                     Gently with a scrungie or clean washcloth. ? 5.  Apply the CHG Soap to your body ONLY FROM THE NECK DOWN.   Do not use on face/ open      ?                     Wound or open sores. Avoid contact with eyes, ears mouth and genitals (private parts).  ?                     Production manager,  Genitals (private parts) with your normal soap. ?            6.  Wash thoroughly, paying special attention to the area where your surgery  will be performed. ? 7.  Thoroughly rinse your body with warm water from the neck down. ? 8.  DO NOT shower/wash with your normal soap after using and rinsing off  the CHG Soap. ?               9.  Pat yourself dry with a clean towel. ?           10.  Wear clean pajamas. ?           11.  Place clean sheets on your bed the night of your first shower and do not  sleep with pets. ?Day of Surgery : ?Do not apply any lotions/deodorants the morning of surgery.  Please wear clean clothes to the hospital/surgery center. ? ?FAILURE TO FOLLOW THESE INSTRUCTIONS MAY RESULT IN THE CANCELLATION OF YOUR SURGERY ?PATIENT SIGNATURE_________________________________ ? ?NURSE SIGNATURE__________________________________ ? ?________________________________________________________________________  ? ? ?           ?

## 2021-12-21 NOTE — Progress Notes (Signed)
[facility]- Preparing for Total Shoulder Arthroplasty  °  °Before surgery, you can play an important role. Because skin is not sterile, your skin needs to be as free of germs as possible. You can reduce the number of germs on your skin by using the following products. °Benzoyl Peroxide Gel °Reduces the number of germs present on the skin °Applied twice a day to shoulder area starting two days before surgery   ° °================================================================== ° °Please follow these instructions carefully: ° °BENZOYL PEROXIDE 5% GEL ° °Please do not use if you have an allergy to benzoyl peroxide.   If your skin becomes reddened/irritated stop using the benzoyl peroxide. ° °Starting two days before surgery, apply as follows: °Apply benzoyl peroxide in the morning and at night. Apply after taking a shower. If you are not taking a shower clean entire shoulder front, back, and side along with the armpit with a clean wet washcloth. ° °Place a quarter-sized dollop on your shoulder and rub in thoroughly, making sure to cover the front, back, and side of your shoulder, along with the armpit.  ° °2 days before ____ AM   ____ PM              1 day before ____ AM   ____ PM °                        °Do this twice a day for two days.  (Last application is the night before surgery, AFTER using the CHG soap as described below). ° °Do NOT apply benzoyl peroxide gel on the day of surgery.  °

## 2021-12-23 ENCOUNTER — Other Ambulatory Visit: Payer: Self-pay

## 2021-12-23 ENCOUNTER — Encounter (HOSPITAL_COMMUNITY): Payer: Self-pay

## 2021-12-23 ENCOUNTER — Encounter (HOSPITAL_COMMUNITY)
Admission: RE | Admit: 2021-12-23 | Discharge: 2021-12-23 | Disposition: A | Discharge status: Home / Self Care | Payer: Medicare HMO | Source: Ambulatory Visit | Attending: Orthopedic Surgery | Admitting: Orthopedic Surgery

## 2021-12-23 VITALS — BP 164/81 | HR 97 | Temp 98.6°F | Resp 16 | Ht 64.0 in | Wt 172.0 lb

## 2021-12-23 DIAGNOSIS — Z01818 Encounter for other preprocedural examination: Secondary | ICD-10-CM

## 2021-12-23 DIAGNOSIS — Z01812 Encounter for preprocedural laboratory examination: Secondary | ICD-10-CM | POA: Diagnosis present

## 2021-12-23 LAB — BASIC METABOLIC PANEL
Anion gap: 8 (ref 5–15)
BUN: 11 mg/dL (ref 8–23)
CO2: 30 mmol/L (ref 22–32)
Calcium: 9.2 mg/dL (ref 8.9–10.3)
Chloride: 95 mmol/L — ABNORMAL LOW (ref 98–111)
Creatinine, Ser: 0.45 mg/dL (ref 0.44–1.00)
GFR, Estimated: >60 mL/min (ref >60)
Glucose, Bld: 109 mg/dL — ABNORMAL HIGH (ref 70–99)
Potassium: 3.8 mmol/L (ref 3.5–5.1)
Sodium: 133 mmol/L — ABNORMAL LOW (ref 135–145)

## 2021-12-23 LAB — CBC
HCT: 40 % (ref 36.0–46.0)
Hemoglobin: 12.5 g/dL (ref 12.0–15.0)
MCH: 25.9 pg — ABNORMAL LOW (ref 26.0–34.0)
MCHC: 31.3 g/dL (ref 30.0–36.0)
MCV: 83 fL (ref 80.0–100.0)
Platelets: 396 10*3/uL (ref 150–400)
RBC: 4.82 MIL/uL (ref 3.87–5.11)
RDW: 15.1 % (ref 11.5–15.5)
WBC: 8.3 10*3/uL (ref 4.0–10.5)
nRBC: 0 % (ref 0.0–0.2)

## 2021-12-23 LAB — SURGICAL PCR SCREEN
MRSA, PCR: NEGATIVE
Staphylococcus aureus: NEGATIVE

## 2021-12-28 DIAGNOSIS — M9903 Segmental and somatic dysfunction of lumbar region: Secondary | ICD-10-CM | POA: Diagnosis not present

## 2021-12-28 DIAGNOSIS — M5136 Other intervertebral disc degeneration, lumbar region: Secondary | ICD-10-CM | POA: Diagnosis not present

## 2021-12-28 DIAGNOSIS — M62830 Muscle spasm of back: Secondary | ICD-10-CM | POA: Diagnosis not present

## 2021-12-28 DIAGNOSIS — M9901 Segmental and somatic dysfunction of cervical region: Secondary | ICD-10-CM | POA: Diagnosis not present

## 2021-12-30 NOTE — Anesthesia Preprocedure Evaluation (Addendum)
Anesthesia Evaluation  ?Patient identified by MRN, date of birth, ID band ?Patient awake ? ? ? ?Reviewed: ?Allergy & Precautions, NPO status , Patient's Chart, lab work & pertinent test results ? ?History of Anesthesia Complications ?Negative for: history of anesthetic complications ? ?Airway ?Mallampati: II ? ?TM Distance: >3 FB ?Neck ROM: Full ? ? ? Dental ? ?(+) Missing,  ?  ?Pulmonary ?former smoker,  ?  ?Pulmonary exam normal ? ? ? ? ? ? ? Cardiovascular ?hypertension, Pt. on medications ?Normal cardiovascular exam ? ? ?  ?Neuro/Psych ?Anxiety negative neurological ROS ?   ? GI/Hepatic ?negative GI ROS,   ?Endo/Other  ?negative endocrine ROS ? Renal/GU ?negative Renal ROS  ?negative genitourinary ?  ?Musculoskeletal ? ?(+) Arthritis , Left shoulder cuff arthropathy  ? Abdominal ?  ?Peds ? Hematology ?negative hematology ROS ?(+)   ?Anesthesia Other Findings ?Day of surgery medications reviewed with patient. ? Reproductive/Obstetrics ?negative OB ROS ? ?  ? ? ? ? ? ? ? ? ? ? ? ? ? ?  ?  ? ? ? ? ? ? ? ?Anesthesia Physical ?Anesthesia Plan ? ?ASA: 2 ? ?Anesthesia Plan: General  ? ?Post-op Pain Management: Regional block* and Toradol IV (intra-op)*  ? ?Induction: Intravenous ? ?PONV Risk Score and Plan: 3 and Treatment may vary due to age or medical condition, Ondansetron and Dexamethasone ? ?Airway Management Planned: Oral ETT ? ?Additional Equipment: None ? ?Intra-op Plan:  ? ?Post-operative Plan: Extubation in OR ? ?Informed Consent: I have reviewed the patients History and Physical, chart, labs and discussed the procedure including the risks, benefits and alternatives for the proposed anesthesia with the patient or authorized representative who has indicated his/her understanding and acceptance.  ? ? ? ?Dental advisory given ? ?Plan Discussed with: CRNA ? ?Anesthesia Plan Comments:   ? ? ? ? ? ?Anesthesia Quick Evaluation ? ?

## 2021-12-31 ENCOUNTER — Encounter (HOSPITAL_COMMUNITY)
Admission: RE | Disposition: A | Discharge status: Home / Self Care | Payer: Self-pay | Source: Home / Self Care | Attending: Orthopedic Surgery

## 2021-12-31 ENCOUNTER — Ambulatory Visit (HOSPITAL_COMMUNITY)
Admission: RE | Admit: 2021-12-31 | Discharge: 2021-12-31 | Disposition: A | Discharge status: Home / Self Care | Payer: Medicare HMO | Attending: Orthopedic Surgery | Admitting: Orthopedic Surgery

## 2021-12-31 ENCOUNTER — Ambulatory Visit (HOSPITAL_COMMUNITY): Payer: Medicare HMO | Admitting: Physician Assistant

## 2021-12-31 ENCOUNTER — Ambulatory Visit (HOSPITAL_BASED_OUTPATIENT_CLINIC_OR_DEPARTMENT_OTHER): Payer: Medicare HMO | Admitting: Anesthesiology

## 2021-12-31 ENCOUNTER — Other Ambulatory Visit: Payer: Self-pay

## 2021-12-31 ENCOUNTER — Encounter (HOSPITAL_COMMUNITY): Payer: Self-pay | Admitting: Orthopedic Surgery

## 2021-12-31 ENCOUNTER — Ambulatory Visit (HOSPITAL_COMMUNITY): Payer: Medicare HMO

## 2021-12-31 DIAGNOSIS — Z96642 Presence of left artificial hip joint: Secondary | ICD-10-CM | POA: Diagnosis not present

## 2021-12-31 DIAGNOSIS — M12812 Other specific arthropathies, not elsewhere classified, left shoulder: Secondary | ICD-10-CM | POA: Diagnosis not present

## 2021-12-31 DIAGNOSIS — M75102 Unspecified rotator cuff tear or rupture of left shoulder, not specified as traumatic: Secondary | ICD-10-CM

## 2021-12-31 DIAGNOSIS — I10 Essential (primary) hypertension: Secondary | ICD-10-CM | POA: Diagnosis not present

## 2021-12-31 DIAGNOSIS — Z87891 Personal history of nicotine dependence: Secondary | ICD-10-CM | POA: Diagnosis not present

## 2021-12-31 DIAGNOSIS — Z471 Aftercare following joint replacement surgery: Secondary | ICD-10-CM | POA: Diagnosis not present

## 2021-12-31 DIAGNOSIS — G8918 Other acute postprocedural pain: Secondary | ICD-10-CM | POA: Diagnosis not present

## 2021-12-31 DIAGNOSIS — Z79899 Other long term (current) drug therapy: Secondary | ICD-10-CM | POA: Insufficient documentation

## 2021-12-31 DIAGNOSIS — M75122 Complete rotator cuff tear or rupture of left shoulder, not specified as traumatic: Secondary | ICD-10-CM | POA: Diagnosis not present

## 2021-12-31 DIAGNOSIS — M19012 Primary osteoarthritis, left shoulder: Secondary | ICD-10-CM | POA: Diagnosis not present

## 2021-12-31 DIAGNOSIS — Z96612 Presence of left artificial shoulder joint: Secondary | ICD-10-CM | POA: Diagnosis not present

## 2021-12-31 HISTORY — PX: REVERSE SHOULDER ARTHROPLASTY: SHX5054

## 2021-12-31 SURGERY — ARTHROPLASTY, SHOULDER, TOTAL, REVERSE
Anesthesia: General | Site: Shoulder | Laterality: Left

## 2021-12-31 MED ORDER — MIDAZOLAM HCL 2 MG/2ML IJ SOLN
INTRAMUSCULAR | Status: AC
  Filled 2021-12-31: qty 2

## 2021-12-31 MED ORDER — BUPIVACAINE-EPINEPHRINE (PF) 0.25% -1:200000 IJ SOLN
INTRAMUSCULAR | Status: AC
  Filled 2021-12-31: qty 30

## 2021-12-31 MED ORDER — BUPIVACAINE-EPINEPHRINE (PF) 0.25% -1:200000 IJ SOLN
INTRAMUSCULAR | Status: DC | PRN
  Administered 2021-12-31: 9 mL via PERINEURAL

## 2021-12-31 MED ORDER — DEXAMETHASONE SODIUM PHOSPHATE 10 MG/ML IJ SOLN
INTRAMUSCULAR | Status: DC | PRN
Start: 2021-12-31 — End: 2021-12-31
  Administered 2021-12-31: 10 mg via INTRAVENOUS

## 2021-12-31 MED ORDER — HYDROCODONE-ACETAMINOPHEN 5-325 MG PO TABS: 1.0000 | ORAL_TABLET | Freq: Four times a day (QID) | ORAL | 0 refills | Status: DC | PRN

## 2021-12-31 MED ORDER — ACETAMINOPHEN 500 MG PO TABS
1000.0000 mg | ORAL_TABLET | Freq: Once | ORAL | Status: AC
  Administered 2021-12-31: 1000 mg via ORAL
  Filled 2021-12-31: qty 2

## 2021-12-31 MED ORDER — ORAL CARE MOUTH RINSE: 15.0000 mL | Freq: Once | OROMUCOSAL | Status: DC

## 2021-12-31 MED ORDER — ORAL CARE MOUTH RINSE: 15.0000 mL | Freq: Once | OROMUCOSAL | Status: AC

## 2021-12-31 MED ORDER — SODIUM CHLORIDE 0.9 % IR SOLN
Status: DC | PRN
  Administered 2021-12-31: 1000 mL

## 2021-12-31 MED ORDER — PHENYLEPHRINE HCL-NACL 20-0.9 MG/250ML-% IV SOLN
INTRAVENOUS | Status: DC | PRN
  Administered 2021-12-31: 40 ug/min via INTRAVENOUS

## 2021-12-31 MED ORDER — CHLORHEXIDINE GLUCONATE 0.12 % MT SOLN
15.0000 mL | Freq: Once | OROMUCOSAL | Status: AC
  Administered 2021-12-31: 15 mL via OROMUCOSAL

## 2021-12-31 MED ORDER — FENTANYL CITRATE PF 50 MCG/ML IJ SOSY: 25.0000 ug | PREFILLED_SYRINGE | INTRAMUSCULAR | Status: DC | PRN

## 2021-12-31 MED ORDER — PROPOFOL 10 MG/ML IV BOLUS
INTRAVENOUS | Status: DC | PRN
  Administered 2021-12-31: 150 mg via INTRAVENOUS

## 2021-12-31 MED ORDER — LACTATED RINGERS IV SOLN: INTRAVENOUS | Status: DC

## 2021-12-31 MED ORDER — CEFAZOLIN SODIUM-DEXTROSE 2-4 GM/100ML-% IV SOLN
2.0000 g | INTRAVENOUS | Status: AC
  Administered 2021-12-31: 2 g via INTRAVENOUS
  Filled 2021-12-31: qty 100

## 2021-12-31 MED ORDER — ROCURONIUM BROMIDE 10 MG/ML (PF) SYRINGE
PREFILLED_SYRINGE | INTRAVENOUS | Status: DC | PRN
  Administered 2021-12-31: 70 mg via INTRAVENOUS

## 2021-12-31 MED ORDER — FENTANYL CITRATE (PF) 100 MCG/2ML IJ SOLN
INTRAMUSCULAR | Status: DC | PRN
  Administered 2021-12-31 (×2): 50 ug via INTRAVENOUS

## 2021-12-31 MED ORDER — BUPIVACAINE LIPOSOME 1.3 % IJ SUSP
INTRAMUSCULAR | Status: DC | PRN
  Administered 2021-12-31: 10 mL via PERINEURAL

## 2021-12-31 MED ORDER — MIDAZOLAM HCL 5 MG/5ML IJ SOLN
INTRAMUSCULAR | Status: DC | PRN
  Administered 2021-12-31: 2 mg via INTRAVENOUS

## 2021-12-31 MED ORDER — PROPOFOL 10 MG/ML IV BOLUS
INTRAVENOUS | Status: AC
  Filled 2021-12-31: qty 20

## 2021-12-31 MED ORDER — ONDANSETRON HCL 4 MG/2ML IJ SOLN
INTRAMUSCULAR | Status: DC | PRN
Start: 2021-12-31 — End: 2021-12-31
  Administered 2021-12-31: 4 mg via INTRAVENOUS

## 2021-12-31 MED ORDER — CHLORHEXIDINE GLUCONATE 0.12 % MT SOLN: 15.0000 mL | Freq: Once | OROMUCOSAL | Status: DC

## 2021-12-31 MED ORDER — ONDANSETRON HCL 4 MG/2ML IJ SOLN
INTRAMUSCULAR | Status: AC
  Filled 2021-12-31: qty 2

## 2021-12-31 MED ORDER — FENTANYL CITRATE (PF) 100 MCG/2ML IJ SOLN
INTRAMUSCULAR | Status: AC
  Filled 2021-12-31: qty 2

## 2021-12-31 MED ORDER — OXYCODONE HCL 5 MG PO TABS: 5.0000 mg | ORAL_TABLET | Freq: Once | ORAL | Status: DC | PRN

## 2021-12-31 MED ORDER — OXYCODONE HCL 5 MG/5ML PO SOLN: 5.0000 mg | Freq: Once | ORAL | Status: DC | PRN

## 2021-12-31 MED ORDER — LIDOCAINE 2% (20 MG/ML) 5 ML SYRINGE
INTRAMUSCULAR | Status: DC | PRN
  Administered 2021-12-31: 60 mg via INTRAVENOUS

## 2021-12-31 MED ORDER — SUGAMMADEX SODIUM 200 MG/2ML IV SOLN
INTRAVENOUS | Status: DC | PRN
  Administered 2021-12-31: 160 mg via INTRAVENOUS

## 2021-12-31 MED ORDER — BUPIVACAINE-EPINEPHRINE (PF) 0.5% -1:200000 IJ SOLN
INTRAMUSCULAR | Status: DC | PRN
  Administered 2021-12-31: 15 mL via PERINEURAL

## 2021-12-31 MED ORDER — METHOCARBAMOL 500 MG PO TABS: 500.0000 mg | ORAL_TABLET | Freq: Three times a day (TID) | ORAL | 1 refills | Status: DC | PRN

## 2021-12-31 SURGICAL SUPPLY — 75 items
AID PSTN UNV HD RSTRNT DISP (MISCELLANEOUS) ×1
BAG COUNTER SPONGE SURGICOUNT (BAG) IMPLANT
BAG SPEC THK2 15X12 ZIP CLS (MISCELLANEOUS)
BAG SPNG CNTER NS LX DISP (BAG)
BAG ZIPLOCK 12X15 (MISCELLANEOUS) IMPLANT
BIT DRILL 1.6MX128 (BIT) IMPLANT
BIT DRILL 170X2.5X (BIT) IMPLANT
BIT DRL 170X2.5X (BIT) ×1
BLADE SAG 18X100X1.27 (BLADE) ×3 IMPLANT
CLSR STERI-STRIP ANTIMIC 1/2X4 (GAUZE/BANDAGES/DRESSINGS) ×1 IMPLANT
COVER BACK TABLE 60X90IN (DRAPES) ×3 IMPLANT
COVER SURGICAL LIGHT HANDLE (MISCELLANEOUS) ×3 IMPLANT
DRAPE INCISE IOBAN 66X45 STRL (DRAPES) ×3 IMPLANT
DRAPE ORTHO SPLIT 77X108 STRL (DRAPES) ×4
DRAPE SHEET LG 3/4 BI-LAMINATE (DRAPES) ×3 IMPLANT
DRAPE SURG ORHT 6 SPLT 77X108 (DRAPES) ×4 IMPLANT
DRAPE TOP 10253 STERILE (DRAPES) ×3 IMPLANT
DRAPE U-SHAPE 47X51 STRL (DRAPES) ×3 IMPLANT
DRILL 2.5 (BIT) ×2
DRSG ADAPTIC 3X8 NADH LF (GAUZE/BANDAGES/DRESSINGS) ×3 IMPLANT
DRSG PAD ABDOMINAL 8X10 ST (GAUZE/BANDAGES/DRESSINGS) ×3 IMPLANT
DURAPREP 26ML APPLICATOR (WOUND CARE) ×3 IMPLANT
ELECT BLADE TIP CTD 4 INCH (ELECTRODE) ×3 IMPLANT
ELECT NDL TIP 2.8 STRL (NEEDLE) ×2 IMPLANT
ELECT NEEDLE TIP 2.8 STRL (NEEDLE) ×2 IMPLANT
ELECT REM PT RETURN 15FT ADLT (MISCELLANEOUS) ×3 IMPLANT
EPI LT SZ 1 (Orthopedic Implant) ×2 IMPLANT
EPIPHYSIS LT SZ 1 (Orthopedic Implant) IMPLANT
FACESHIELD WRAPAROUND (MASK) ×2 IMPLANT
FACESHIELD WRAPAROUND OR TEAM (MASK) ×2 IMPLANT
GAUZE SPONGE 4X4 12PLY STRL (GAUZE/BANDAGES/DRESSINGS) ×3 IMPLANT
GLENOSPHERE DELTA XTEND LAT 38 (Miscellaneous) ×1 IMPLANT
GLOVE SURG ORTHO LTX SZ7.5 (GLOVE) ×3 IMPLANT
GLOVE SURG ORTHO LTX SZ8.5 (GLOVE) ×3 IMPLANT
GLOVE SURG UNDER POLY LF SZ7.5 (GLOVE) ×3 IMPLANT
GLOVE SURG UNDER POLY LF SZ8.5 (GLOVE) ×3 IMPLANT
GOWN STRL REUS W/ TWL XL LVL3 (GOWN DISPOSABLE) ×4 IMPLANT
GOWN STRL REUS W/TWL XL LVL3 (GOWN DISPOSABLE) ×4
KIT BASIN OR (CUSTOM PROCEDURE TRAY) ×3 IMPLANT
KIT TURNOVER KIT A (KITS) IMPLANT
MANIFOLD NEPTUNE II (INSTRUMENTS) ×3 IMPLANT
METAGLENE DELTA EXTEND (Trauma) IMPLANT
METAGLENE DXTEND (Trauma) ×2 IMPLANT
NDL MAYO CATGUT SZ4 TPR NDL (NEEDLE) IMPLANT
NEEDLE MAYO CATGUT SZ4 (NEEDLE) IMPLANT
NS IRRIG 1000ML POUR BTL (IV SOLUTION) ×3 IMPLANT
PACK SHOULDER (CUSTOM PROCEDURE TRAY) ×3 IMPLANT
PIN GUIDE 1.2 (PIN) ×1 IMPLANT
PIN GUIDE GLENOPHERE 1.5MX300M (PIN) ×1 IMPLANT
PIN METAGLENE 2.5 (PIN) ×1 IMPLANT
PROTECTOR NERVE ULNAR (MISCELLANEOUS) ×3 IMPLANT
RESTRAINT HEAD UNIVERSAL NS (MISCELLANEOUS) ×3 IMPLANT
SCREW 4.5X36MM (Screw) ×1 IMPLANT
SCREW LOCK DELTA XTEND 4.5X30 (Screw) ×1 IMPLANT
SLING ARM FOAM STRAP LRG (SOFTGOODS) ×1 IMPLANT
SMARTMIX MINI TOWER (MISCELLANEOUS)
SPACER 38 PLUS 3 (Spacer) ×1 IMPLANT
SPIKE FLUID TRANSFER (MISCELLANEOUS) ×3 IMPLANT
SPONGE T-LAP 18X18 ~~LOC~~+RFID (SPONGE) ×3 IMPLANT
SPONGE T-LAP 4X18 ~~LOC~~+RFID (SPONGE) ×3 IMPLANT
STEM HUMERAL SZ8 STANDARD (Stem) ×2 IMPLANT
STEM HUMERAL SZ8 STD (Stem) IMPLANT
STRIP CLOSURE SKIN 1/2X4 (GAUZE/BANDAGES/DRESSINGS) ×3 IMPLANT
SUCTION FRAZIER HANDLE 10FR (MISCELLANEOUS) ×2
SUCTION TUBE FRAZIER 10FR DISP (MISCELLANEOUS) ×2 IMPLANT
SUT FIBERWIRE #2 38 T-5 BLUE (SUTURE) ×4
SUT MNCRL AB 4-0 PS2 18 (SUTURE) ×3 IMPLANT
SUT VIC AB 0 CT1 36 (SUTURE) ×6 IMPLANT
SUT VIC AB 0 CT2 27 (SUTURE) ×3 IMPLANT
SUT VIC AB 2-0 CT1 27 (SUTURE) ×2
SUT VIC AB 2-0 CT1 TAPERPNT 27 (SUTURE) ×2 IMPLANT
SUTURE FIBERWR #2 38 T-5 BLUE (SUTURE) ×4 IMPLANT
TAPE PAPER 3X10 WHT MICROPORE (GAUZE/BANDAGES/DRESSINGS) ×1 IMPLANT
TOWEL OR 17X26 10 PK STRL BLUE (TOWEL DISPOSABLE) ×3 IMPLANT
TOWER SMARTMIX MINI (MISCELLANEOUS) IMPLANT

## 2021-12-31 NOTE — Anesthesia Procedure Notes (Signed)
?  Anesthesia Regional Block: Interscalene brachial plexus block  ? ?Pre-Anesthetic Checklist: , timeout performed,  Correct Patient, Correct Site, Correct Laterality,  Correct Procedure, Correct Position, site marked,  Risks and benefits discussed,  Pre-op evaluation,  At surgeon's request and post-op pain management ? ?Laterality: Left ? ?Prep: Maximum Sterile Barrier Precautions used, chloraprep     ?  ?Needles:  ?Injection technique: Single-shot ? ?Needle Type: Echogenic Stimulator Needle   ? ? ?Needle Length: 4cm  ?Needle Gauge: 22  ? ? ? ?Additional Needles: ? ? ?Procedures:,,,, ultrasound used (permanent image in chart),,    ?Narrative:  ?Start time: 12/31/2021 7:18 AM ?End time: 12/31/2021 7:21 AM ?Injection made incrementally with aspirations every 5 mL. ? ?Performed by: Personally  ?Anesthesiologist: Brennan Bailey, MD ? ?Additional Notes: ?Risks, benefits, and alternative discussed. Patient gave consent for procedure. Patient prepped and draped in sterile fashion. Sedation administered, patient remains easily responsive to voice. Relevant anatomy identified with ultrasound guidance. Local anesthetic given in 5cc increments with no signs or symptoms of intravascular injection. No pain or paraesthesias with injection. Patient monitored throughout procedure with signs of LAST or immediate complications. Tolerated well. Ultrasound image placed in chart.  ?Tawny Asal, MD ? ? ? ? ? ?

## 2021-12-31 NOTE — Transfer of Care (Signed)
Immediate Anesthesia Transfer of Care Note ? ?Patient: Jacqueline Ryan ? ?Procedure(s) Performed: Procedure(s) with comments: ?REVERSE SHOULDER ARTHROPLASTY (Left) - interscalene block ? ?Patient Location: PACU ? ?Anesthesia Type:General ? ?Level of Consciousness: Alert, Awake, Oriented ? ?Airway & Oxygen Therapy: Patient Spontanous Breathing ? ?Post-op Assessment: Report given to RN ? ?Post vital signs: Reviewed and stable ? ?Last Vitals:  ?Vitals:  ? 12/31/21 0554  ?BP: (!) 168/92  ?Pulse: 95  ?Resp: 17  ?Temp: 36.9 ?C  ?SpO2: 98%  ? ? ?Complications: No apparent anesthesia complications ? ?

## 2021-12-31 NOTE — Anesthesia Procedure Notes (Signed)
Procedure Name: Intubation ?Date/Time: 12/31/2021 7:38 AM ?Performed by: Gerald Leitz, CRNA ?Pre-anesthesia Checklist: Patient identified, Patient being monitored, Timeout performed, Emergency Drugs available and Suction available ?Patient Re-evaluated:Patient Re-evaluated prior to induction ?Oxygen Delivery Method: Circle system utilized ?Preoxygenation: Pre-oxygenation with 100% oxygen ?Induction Type: IV induction ?Ventilation: Mask ventilation without difficulty ?Laryngoscope Size: Mac and 3 ?Grade View: Grade I ?Tube type: Oral ?Tube size: 7.0 mm ?Number of attempts: 1 ?Airway Equipment and Method: Stylet ?Placement Confirmation: ETT inserted through vocal cords under direct vision, positive ETCO2 and breath sounds checked- equal and bilateral ?Secured at: 21 cm ?Tube secured with: Tape ?Dental Injury: Teeth and Oropharynx as per pre-operative assessment  ? ? ? ? ?

## 2021-12-31 NOTE — Op Note (Signed)
NAME: Jacqueline Ryan, Jacqueline Ryan ?MEDICAL RECORD NO: [id number] ?ACCOUNT NO: [id number] ?DATE OF BIRTH: 1945/12/13 ?FACILITY: [facility] ?LOCATION: [facility] ?PHYSICIAN: Doran Heater. Veverly Fells, MD ? ?Operative Report  ? ?DATE OF PROCEDURE: 12/31/2021 ? ?PREOPERATIVE DIAGNOSIS:  Left shoulder rotator cuff tear arthropathy. ? ?POSTOPERATIVE DIAGNOSIS:  Left shoulder rotator cuff tear arthropathy. ? ?PROCEDURE PERFORMED:  Left reverse shoulder replacement using DePuy Delta Xtend prosthesis, no subscap repair. ? ?ATTENDING SURGEON:  Dr. Esmond Plants. ? ?ASSISTANT:  Darol Destine, PA-C, who was scrubbed during the entire procedure, and was necessary for satisfactory completion of surgery. ? ?ANESTHESIA: General anesthesia was used plus interscalene block. ? ?ESTIMATED BLOOD LOSS:  100 mL. ? ?FLUID REPLACEMENT:  1500 mL crystalloid. ? ?COUNTS: Instrument counts correct. ? ?COMPLICATIONS: No complications.  ? ?ANTIBIOTICS: Perioperative antibiotics were given. ? ?INDICATIONS:  The patient is a 76 year old female who presents with a history of worsening left shoulder pain and dysfunction due to rotator cuff tear arthropathy.  Having failed conservative management over an extended period of time, the patient  ?presents for operative treatment to relieve pain and restore function.  Informed consent obtained. ? ?DESCRIPTION OF PROCEDURE:  After an adequate level of general anesthesia was achieved, the patient was positioned in modified beach chair position.  Left shoulder correctly identified and a sterile prep and drape performed. Time-out called, verifying  ?correct patient, correct site. We entered the patient's shoulder using standard deltopectoral approach, starting at the coracoid process, extending down to the anterior humerus.  Dissection down through subcutaneous tissues using Bovie.  Cephalic vein  ?was identified, taken laterally with the deltoid. Pectoralis taken medially.  Conjoined tendon identified and retracted medially.  Deep  retractors placed.  Biceps tenodesed in situ with 0 Vicryl figure-of-eight suture x 2.  We then released the  ?subscapularis remnant off the lesser tuberosity, tagged with #2 FiberWire suture to protect the axillary nerve.  We then released the inferior capsule progressively externally rotating and delivering the humerus out of the wound.  We entered the humerus  ?with a 6 mm reamer, reaming up to a size 8.  We then placed our 8 mm T handle guide and resected the humeral head at 155 degrees at 20 degrees of retroversion.  We then removed excess osteophytes with a rongeur.  We then subluxed the humerus posteriorly, ? gaining good exposure of the glenoid face, which was devoid of cartilage.  We removed the capsule and the labrum protecting again the axillary nerve.  We found our center point on our glenoid and drilled a guide pin.  We then reamed for the metaglene  ?baseplate and then did our peripheral hand reaming and then drilled out our central peg hole.  We then impacted the metaglene baseplate into position.  We had good baseplate support.  We then placed a 36-screw inferiorly, a 30 superiorly, both with  ?excellent purchase.  We felt that we could not get anterior and posterior screws in due to the small size of the glenoid. With the baseplate secured, we selected a 38 standard glenosphere and placed it onto the baseplate and secured it in place with a  ?screwdriver.  I did a finger sweep to make sure no soft tissue was caught up in that bearing.  We next went back to the humeral side. The humeral bone was horrible and there was some comminution anteriorly just from retraction.  We then very gently  ?reamed for the one left metaphysis and then selected the real 8 Porocoat press-fit stem with  one left HA coated metaphysis and we attached that at the 0 position.  We then impacted that with available bone graft using impaction grafting technique in 20  ?degrees of retroversion.  We had good humeral stability with  that implant and we felt like we had adequate support without cement.  So we went ahead and went with a press-fit.  Next, we selected the 38+3 trial poly and placed on the humeral tray and  ?reduced the shoulder.  We were pleased with our soft tissue tensioning, stability, and balance.  We removed the trial 38+3 and then selected the real 38+3 poly, impacted on the humeral tray, reduced the shoulder, it had nice little pop as it reduced and  ?again appropriate stability and constraint.  At this point, we irrigated thoroughly and then resected the subscap remnant and then closed the deltopectoral interval with 0 Vicryl suture followed by 2-0 Vicryl for subcutaneous closure and 4-0 Monocryl for ? skin.  Steri-Strips applied followed by sterile dressing.  The patient tolerated surgery well. ? ? ?[facility] ?D: 12/31/2021 9:12:24 am T: 12/31/2021 9:35:00 am  ?JOB: [id number]/ [id number]  ?

## 2021-12-31 NOTE — Interval H&P Note (Signed)
History and Physical Interval Note: ? ?12/31/2021 ?7:20 AM ? ?Jacqueline Ryan  has presented today for surgery, with the diagnosis of Left shoulder cuff arthropathy.  The various methods of treatment have been discussed with the patient and family. After consideration of risks, benefits and other options for treatment, the patient has consented to  Procedure(s) with comments: ?REVERSE SHOULDER ARTHROPLASTY (Left) - interscalene block as a surgical intervention.  The patient's history has been reviewed, patient examined, no change in status, stable for surgery.  I have reviewed the patient's chart and labs.  Questions were answered to the patient's satisfaction.   ? ? ?Augustin Schooling ? ? ?

## 2021-12-31 NOTE — Anesthesia Postprocedure Evaluation (Signed)
Anesthesia Post Note ? ?Patient: YOLANDER GOODIE ? ?Procedure(s) Performed: REVERSE SHOULDER ARTHROPLASTY (Left: Shoulder) ? ?  ? ?Patient location during evaluation: PACU ?Anesthesia Type: General ?Level of consciousness: awake and alert ?Pain management: pain level controlled ?Vital Signs Assessment: post-procedure vital signs reviewed and stable ?Respiratory status: spontaneous breathing, nonlabored ventilation and respiratory function stable ?Cardiovascular status: blood pressure returned to baseline ?Postop Assessment: no apparent nausea or vomiting ?Anesthetic complications: no ? ? ?No notable events documented. ? ?Last Vitals:  ?Vitals:  ? 12/31/21 0945 12/31/21 0949  ?BP: (!) 156/76 (!) 164/92  ?Pulse: 97 (!) 111  ?Resp: 15 20  ?Temp: 36.4 ?C 36.4 ?C  ?SpO2: 97% 94%  ?  ?Last Pain:  ?Vitals:  ? 12/31/21 0949  ?TempSrc: Oral  ?PainSc: 0-No pain  ? ? ?  ?  ?  ?  ?  ?  ? ?Marthenia Rolling ? ? ? ? ?

## 2021-12-31 NOTE — Discharge Instructions (Signed)
Ice to the shoulder constantly.  Keep the incision covered and clean and dry for one week, then ok to get it wet in the shower. ? ?Do gentle exercise as instructed several times per day. ? ?DO NOT reach behind your back or push up out of a chair with the operative arm. ? ?Use a sling while you are up and around for comfort, may remove while seated.  Keep pillow propped behind the operative elbow. ? ?Follow up with Dr Veverly Fells in two weeks in the office, call [phone] for appt  ?

## 2021-12-31 NOTE — Evaluation (Signed)
Occupational Therapy Evaluation ?Patient Details ?Name: Jacqueline Ryan ?MRN: [id number] ?DOB: April 09, 1946 ?Today's Date: 12/31/2021 ? ? ?History of Present Illness Patient is a 76 year old female s/p left reverse shoulder arthroplasty. PMH: L THA, anxiety  ? ?Clinical Impression ?  ?Patient is a 76 year old female s/p shoulder replacement without functional use of left non-dominant upper extremity secondary to effects of surgery and interscalene block and shoulder precautions. Therapist provided education and instruction to patient in regards to exercises, precautions, positioning, donning upper extremity clothing and bathing while maintaining shoulder precautions, ice and edema management and donning/doffing sling. Patient verbalized understanding and demonstrated as needed. Patient needed assistance to donn shirt, underwear, pants, socks and shoes and provided with instruction on compensatory strategies to perform ADLs. Patient to follow up with MD for further therapy needs.  ?  ?   ? ?Recommendations for follow up therapy are one component of a multi-disciplinary discharge planning process, led by the attending physician.  Recommendations may be updated based on patient status, additional functional criteria and insurance authorization.  ? ?Follow Up Recommendations ? Follow physician's recommendations for discharge plan and follow up therapies  ?  ?Assistance Recommended at Discharge Intermittent Supervision/Assistance  ?Patient can return home with the following A little help with walking and/or transfers;A little help with bathing/dressing/bathroom ? ?  ?Functional Status Assessment ? Patient has had a recent decline in their functional status and demonstrates the ability to make significant improvements in function in a reasonable and predictable amount of time.  ?Equipment Recommendations ? None recommended by OT  ?  ?   ?Precautions / Restrictions Precautions ?Precautions: Shoulder ?Type of Shoulder Precautions:  AROM elbow, wrist, hand. A/PROM shoulder NO ?Shoulder Interventions: Shoulder sling/immobilizer;Off for dressing/bathing/exercises ?Precaution Booklet Issued: Yes (comment) ?Required Braces or Orthoses: Sling ?Restrictions ?Weight Bearing Restrictions: Yes ?LUE Weight Bearing: Non weight bearing  ? ?  ? ?   ?Balance Overall balance assessment: Mild deficits observed, not formally tested ?  ?  ?  ?  ?  ?  ?  ?  ?  ?  ?  ?  ?  ?  ?  ?  ?  ?  ?   ? ?ADL either performed or assessed with clinical judgement  ? ?ADL Overall ADL's : Needs assistance/impaired ?Eating/Feeding: Independent;Sitting ?  ?Grooming: Independent;Sitting ?  ?Upper Body Bathing: Minimal assistance ?  ?Lower Body Bathing: Min guard;Sit to/from stand;Sitting/lateral leans ?  ?Upper Body Dressing : Standing;Moderate assistance ?Upper Body Dressing Details (indicate cue type and reason): Assist to thread L UE and button shirt ?Lower Body Dressing: Min guard;Sitting/lateral leans;Sit to/from stand ?Lower Body Dressing Details (indicate cue type and reason): Min G for standing balance only ?Toilet Transfer: Min guard;Ambulation ?  ?Toileting- Water quality scientist and Hygiene: Min guard;Sit to/from stand;Sitting/lateral lean ?  ?  ?  ?Functional mobility during ADLs: Min guard ?General ADL Comments: Patient educated in shoulder precautions and how to maintain during self care tasks.  ? ? ? ? ?Pertinent Vitals/Pain Pain Assessment ?Pain Assessment: Faces ?Faces Pain Scale: Hurts a little bit ?Pain Location: L UE ?Pain Descriptors / Indicators: Numbness, Heaviness ?Pain Intervention(s): Monitored during session  ? ? ? ?Hand Dominance Right ?  ?Extremity/Trunk Assessment Upper Extremity Assessment ?Upper Extremity Assessment: LUE deficits/detail ?LUE Deficits / Details: + nerve block ?  ?Lower Extremity Assessment ?Lower Extremity Assessment: Overall WFL for tasks assessed ?  ?  ?  ?Communication Communication ?Communication: No difficulties ?  ?Cognition  Arousal/Alertness: Awake/alert ?Behavior  During Therapy: WFL for tasks assessed/performed, Anxious ?Overall Cognitive Status: Within Functional Limits for tasks assessed ?  ?  ?  ?  ?  ?  ?  ?  ?  ?  ?  ?  ?  ?  ?  ?  ?  ?  ?  ?   ?Exercises Exercises: Shoulder ?  ?Shoulder Instructions Shoulder Instructions ?Donning/doffing shirt without moving shoulder: Moderate assistance;Patient able to independently direct caregiver ?Method for sponge bathing under operated UE: Minimal assistance;Patient able to independently direct caregiver ?Donning/doffing sling/immobilizer: Maximal assistance;Patient able to independently direct caregiver ?Correct positioning of sling/immobilizer: Minimal assistance;Patient able to independently direct caregiver ?ROM for elbow, wrist and digits of operated UE: Patient able to independently direct caregiver ?Sling wearing schedule (on at all times/off for ADL's): Patient able to independently direct caregiver ?Proper positioning of operated UE when showering: Patient able to independently direct caregiver ?Positioning of UE while sleeping: Patient able to independently direct caregiver  ? ? ?Home Living Family/patient expects to be discharged to:: Private residence ?Living Arrangements: Spouse/significant other ?  ?  ?  ?  ?  ?  ?  ?  ?  ?  ?  ?  ?  ?  ?  ?  ?  ? ?  ?Prior Functioning/Environment Prior Level of Function : Needs assist ?  ?  ?  ?  ?  ?  ?Mobility Comments: Ambulates with a cane ?ADLs Comments: Patient reports she has difficulty getting up from low surfaces at home ?  ? ?  ?  ?OT Problem List: Pain;Impaired UE functional use;Decreased knowledge of precautions ?  ?   ?   ?OT Goals(Current goals can be found in the care plan section) Acute Rehab OT Goals ?Patient Stated Goal: Home with spouse ?OT Goal Formulation: All assessment and education complete, DC therapy  ? ?AM-PAC OT "6 Clicks" Daily Activity     ?Outcome Measure Help from another person eating meals?: None ?Help from  another person taking care of personal grooming?: None ?Help from another person toileting, which includes using toliet, bedpan, or urinal?: A Little ?Help from another person bathing (including washing, rinsing, drying)?: A Little ?Help from another person to put on and taking off regular upper body clothing?: A Lot ?Help from another person to put on and taking off regular lower body clothing?: A Little ?6 Click Score: 19 ?  ?End of Session Equipment Utilized During Treatment: Other (comment) (sling) ?Nurse Communication: Other (comment) (OT complete) ? ?Activity Tolerance: Patient tolerated treatment well ?Patient left: in chair;with nursing/sitter in room ? ?OT Visit Diagnosis: Pain ?Pain - Right/Left: Left ?Pain - part of body: Shoulder  ?              ?Time: 1022-1050 ?OT Time Calculation (min): 28 min ?Charges:  OT General Charges ?$OT Visit: 1 Visit ?OT Evaluation ?$OT Eval Low Complexity: 1 Low ?OT Treatments ?$Self Care/Home Management : 8-22 mins ? ?Delbert Phenix OT ?OT pager: [phone] ? ? ?Rosemary Holms ?12/31/2021, 11:11 AM ?

## 2021-12-31 NOTE — Brief Op Note (Signed)
12/31/2021 ? ?9:07 AM ? ?PATIENT:  Jacqueline Ryan  76 y.o. female ? ?PRE-OPERATIVE DIAGNOSIS:  Left shoulder cuff arthropathy ? ?POST-OPERATIVE DIAGNOSIS:  Left shoulder cuff arthropathy ? ?PROCEDURE:  Procedure(s) with comments: ?REVERSE SHOULDER ARTHROPLASTY (Left) - interscalene block Depuy delta Extend with NO subscap repair ? ?SURGEON:  Surgeon(s) and Role: ?   Netta Cedars, MD - Primary ? ?PHYSICIAN ASSISTANT:  ? ?ASSISTANTS: Ventura Bruns, PA-C  ? ?ANESTHESIA:   regional and general ? ?EBL:  100 mL  ? ?BLOOD ADMINISTERED:none ? ?DRAINS: none  ? ?LOCAL MEDICATIONS USED:  MARCAINE    ? ?SPECIMEN:  No Specimen ? ?DISPOSITION OF SPECIMEN:  N/A ? ?COUNTS:  YES ? ?TOURNIQUET:  * No tourniquets in log * ? ?DICTATION: .Other Dictation: Dictation Number [id number] ? ?PLAN OF CARE: Discharge to home after PACU ? ?PATIENT DISPOSITION:  PACU - hemodynamically stable. ?  ?Delay start of Pharmacological VTE agent (>24hrs) due to surgical blood loss or risk of bleeding: not applicable ? ?

## 2022-01-05 ENCOUNTER — Encounter (HOSPITAL_COMMUNITY): Payer: Self-pay | Admitting: Orthopedic Surgery

## 2022-01-13 DIAGNOSIS — Z4789 Encounter for other orthopedic aftercare: Secondary | ICD-10-CM | POA: Diagnosis not present

## 2022-01-25 DIAGNOSIS — M9903 Segmental and somatic dysfunction of lumbar region: Secondary | ICD-10-CM | POA: Diagnosis not present

## 2022-01-25 DIAGNOSIS — M9901 Segmental and somatic dysfunction of cervical region: Secondary | ICD-10-CM | POA: Diagnosis not present

## 2022-01-25 DIAGNOSIS — M62830 Muscle spasm of back: Secondary | ICD-10-CM | POA: Diagnosis not present

## 2022-01-25 DIAGNOSIS — M5136 Other intervertebral disc degeneration, lumbar region: Secondary | ICD-10-CM | POA: Diagnosis not present

## 2022-02-01 DIAGNOSIS — M9903 Segmental and somatic dysfunction of lumbar region: Secondary | ICD-10-CM | POA: Diagnosis not present

## 2022-02-01 DIAGNOSIS — M5136 Other intervertebral disc degeneration, lumbar region: Secondary | ICD-10-CM | POA: Diagnosis not present

## 2022-02-01 DIAGNOSIS — M62830 Muscle spasm of back: Secondary | ICD-10-CM | POA: Diagnosis not present

## 2022-02-01 DIAGNOSIS — M9901 Segmental and somatic dysfunction of cervical region: Secondary | ICD-10-CM | POA: Diagnosis not present

## 2022-02-09 DIAGNOSIS — Z96612 Presence of left artificial shoulder joint: Secondary | ICD-10-CM | POA: Diagnosis not present

## 2022-02-09 DIAGNOSIS — Z471 Aftercare following joint replacement surgery: Secondary | ICD-10-CM | POA: Diagnosis not present

## 2022-02-15 DIAGNOSIS — M9903 Segmental and somatic dysfunction of lumbar region: Secondary | ICD-10-CM | POA: Diagnosis not present

## 2022-02-15 DIAGNOSIS — M9901 Segmental and somatic dysfunction of cervical region: Secondary | ICD-10-CM | POA: Diagnosis not present

## 2022-02-15 DIAGNOSIS — M62830 Muscle spasm of back: Secondary | ICD-10-CM | POA: Diagnosis not present

## 2022-02-15 DIAGNOSIS — M5136 Other intervertebral disc degeneration, lumbar region: Secondary | ICD-10-CM | POA: Diagnosis not present

## 2022-02-22 DIAGNOSIS — M9903 Segmental and somatic dysfunction of lumbar region: Secondary | ICD-10-CM | POA: Diagnosis not present

## 2022-02-22 DIAGNOSIS — M5136 Other intervertebral disc degeneration, lumbar region: Secondary | ICD-10-CM | POA: Diagnosis not present

## 2022-02-22 DIAGNOSIS — M62830 Muscle spasm of back: Secondary | ICD-10-CM | POA: Diagnosis not present

## 2022-02-22 DIAGNOSIS — M9901 Segmental and somatic dysfunction of cervical region: Secondary | ICD-10-CM | POA: Diagnosis not present

## 2022-03-01 DIAGNOSIS — M9901 Segmental and somatic dysfunction of cervical region: Secondary | ICD-10-CM | POA: Diagnosis not present

## 2022-03-01 DIAGNOSIS — M62830 Muscle spasm of back: Secondary | ICD-10-CM | POA: Diagnosis not present

## 2022-03-01 DIAGNOSIS — M9903 Segmental and somatic dysfunction of lumbar region: Secondary | ICD-10-CM | POA: Diagnosis not present

## 2022-03-01 DIAGNOSIS — M5136 Other intervertebral disc degeneration, lumbar region: Secondary | ICD-10-CM | POA: Diagnosis not present

## 2022-03-02 DIAGNOSIS — M25512 Pain in left shoulder: Secondary | ICD-10-CM | POA: Diagnosis not present

## 2022-03-10 DIAGNOSIS — M62830 Muscle spasm of back: Secondary | ICD-10-CM | POA: Diagnosis not present

## 2022-03-10 DIAGNOSIS — M9903 Segmental and somatic dysfunction of lumbar region: Secondary | ICD-10-CM | POA: Diagnosis not present

## 2022-03-10 DIAGNOSIS — M9901 Segmental and somatic dysfunction of cervical region: Secondary | ICD-10-CM | POA: Diagnosis not present

## 2022-03-10 DIAGNOSIS — M5136 Other intervertebral disc degeneration, lumbar region: Secondary | ICD-10-CM | POA: Diagnosis not present

## 2022-04-05 DIAGNOSIS — M9903 Segmental and somatic dysfunction of lumbar region: Secondary | ICD-10-CM | POA: Diagnosis not present

## 2022-04-05 DIAGNOSIS — M9901 Segmental and somatic dysfunction of cervical region: Secondary | ICD-10-CM | POA: Diagnosis not present

## 2022-04-05 DIAGNOSIS — M5136 Other intervertebral disc degeneration, lumbar region: Secondary | ICD-10-CM | POA: Diagnosis not present

## 2022-04-05 DIAGNOSIS — M62830 Muscle spasm of back: Secondary | ICD-10-CM | POA: Diagnosis not present

## 2022-04-15 DIAGNOSIS — I10 Essential (primary) hypertension: Secondary | ICD-10-CM | POA: Diagnosis not present

## 2022-04-15 DIAGNOSIS — J449 Chronic obstructive pulmonary disease, unspecified: Secondary | ICD-10-CM | POA: Diagnosis not present

## 2022-04-15 DIAGNOSIS — M8580 Other specified disorders of bone density and structure, unspecified site: Secondary | ICD-10-CM | POA: Diagnosis not present

## 2022-04-20 DIAGNOSIS — T7840XA Allergy, unspecified, initial encounter: Secondary | ICD-10-CM | POA: Diagnosis not present

## 2022-04-20 DIAGNOSIS — I10 Essential (primary) hypertension: Secondary | ICD-10-CM | POA: Diagnosis not present

## 2022-04-20 DIAGNOSIS — L509 Urticaria, unspecified: Secondary | ICD-10-CM | POA: Diagnosis not present

## 2022-04-26 DIAGNOSIS — M9901 Segmental and somatic dysfunction of cervical region: Secondary | ICD-10-CM | POA: Diagnosis not present

## 2022-04-26 DIAGNOSIS — M62830 Muscle spasm of back: Secondary | ICD-10-CM | POA: Diagnosis not present

## 2022-04-26 DIAGNOSIS — M9903 Segmental and somatic dysfunction of lumbar region: Secondary | ICD-10-CM | POA: Diagnosis not present

## 2022-04-26 DIAGNOSIS — M5136 Other intervertebral disc degeneration, lumbar region: Secondary | ICD-10-CM | POA: Diagnosis not present

## 2022-05-07 ENCOUNTER — Encounter (HOSPITAL_COMMUNITY): Payer: Self-pay

## 2022-05-07 ENCOUNTER — Other Ambulatory Visit: Payer: Self-pay

## 2022-05-07 ENCOUNTER — Emergency Department (HOSPITAL_COMMUNITY): Payer: Medicare HMO

## 2022-05-07 ENCOUNTER — Emergency Department (HOSPITAL_COMMUNITY)
Admission: EM | Admit: 2022-05-07 | Discharge: 2022-05-07 | Disposition: A | Discharge status: Home / Self Care | Payer: Medicare HMO | Attending: Emergency Medicine | Admitting: Emergency Medicine

## 2022-05-07 DIAGNOSIS — T783XXA Angioneurotic edema, initial encounter: Secondary | ICD-10-CM | POA: Diagnosis not present

## 2022-05-07 DIAGNOSIS — K115 Sialolithiasis: Secondary | ICD-10-CM | POA: Diagnosis not present

## 2022-05-07 DIAGNOSIS — D649 Anemia, unspecified: Secondary | ICD-10-CM | POA: Insufficient documentation

## 2022-05-07 DIAGNOSIS — J384 Edema of larynx: Secondary | ICD-10-CM | POA: Diagnosis not present

## 2022-05-07 DIAGNOSIS — I10 Essential (primary) hypertension: Secondary | ICD-10-CM | POA: Diagnosis not present

## 2022-05-07 DIAGNOSIS — E876 Hypokalemia: Secondary | ICD-10-CM | POA: Diagnosis not present

## 2022-05-07 DIAGNOSIS — J392 Other diseases of pharynx: Secondary | ICD-10-CM | POA: Diagnosis not present

## 2022-05-07 DIAGNOSIS — M4312 Spondylolisthesis, cervical region: Secondary | ICD-10-CM | POA: Diagnosis not present

## 2022-05-07 DIAGNOSIS — K1120 Sialoadenitis, unspecified: Secondary | ICD-10-CM | POA: Insufficient documentation

## 2022-05-07 DIAGNOSIS — R220 Localized swelling, mass and lump, head: Secondary | ICD-10-CM | POA: Diagnosis present

## 2022-05-07 LAB — COMPREHENSIVE METABOLIC PANEL
ALT: 20 U/L (ref 0–44)
AST: 23 U/L (ref 15–41)
Albumin: 3.7 g/dL (ref 3.5–5.0)
Alkaline Phosphatase: 120 U/L (ref 38–126)
Anion gap: 10 (ref 5–15)
BUN: 12 mg/dL (ref 8–23)
CO2: 27 mmol/L (ref 22–32)
Calcium: 9.2 mg/dL (ref 8.9–10.3)
Chloride: 96 mmol/L — ABNORMAL LOW (ref 98–111)
Creatinine, Ser: 0.61 mg/dL (ref 0.44–1.00)
GFR, Estimated: >60 mL/min (ref >60)
Glucose, Bld: 127 mg/dL — ABNORMAL HIGH (ref 70–99)
Potassium: 3.3 mmol/L — ABNORMAL LOW (ref 3.5–5.1)
Sodium: 133 mmol/L — ABNORMAL LOW (ref 135–145)
Total Bilirubin: 0.9 mg/dL (ref 0.3–1.2)
Total Protein: 7.3 g/dL (ref 6.5–8.1)

## 2022-05-07 LAB — CBC WITH DIFFERENTIAL/PLATELET
Abs Immature Granulocytes: 0.04 10*3/uL (ref 0.00–0.07)
Basophils Absolute: 0.1 10*3/uL (ref 0.0–0.1)
Basophils Relative: 1 %
Eosinophils Absolute: 0 10*3/uL (ref 0.0–0.5)
Eosinophils Relative: 0 %
HCT: 32.7 % — ABNORMAL LOW (ref 36.0–46.0)
Hemoglobin: 9.8 g/dL — ABNORMAL LOW (ref 12.0–15.0)
Immature Granulocytes: 0 %
Lymphocytes Relative: 19 %
Lymphs Abs: 1.9 10*3/uL (ref 0.7–4.0)
MCH: 21.6 pg — ABNORMAL LOW (ref 26.0–34.0)
MCHC: 30 g/dL (ref 30.0–36.0)
MCV: 72.2 fL — ABNORMAL LOW (ref 80.0–100.0)
Monocytes Absolute: 1.4 10*3/uL — ABNORMAL HIGH (ref 0.1–1.0)
Monocytes Relative: 14 %
Neutro Abs: 6.6 10*3/uL (ref 1.7–7.7)
Neutrophils Relative %: 66 %
Platelets: 347 10*3/uL (ref 150–400)
RBC: 4.53 MIL/uL (ref 3.87–5.11)
RDW: 20.2 % — ABNORMAL HIGH (ref 11.5–15.5)
WBC: 10 10*3/uL (ref 4.0–10.5)
nRBC: 0 % (ref 0.0–0.2)

## 2022-05-07 MED ORDER — AMOXICILLIN-POT CLAVULANATE 875-125 MG PO TABS: 1.0000 | ORAL_TABLET | Freq: Two times a day (BID) | ORAL | 0 refills | Status: DC

## 2022-05-07 MED ORDER — IOHEXOL 300 MG/ML  SOLN
80.0000 mL | Freq: Once | INTRAMUSCULAR | Status: AC | PRN
  Administered 2022-05-07: 80 mL via INTRAVENOUS

## 2022-05-07 MED ORDER — IBUPROFEN 200 MG PO TABS
600.0000 mg | ORAL_TABLET | Freq: Once | ORAL | Status: DC
  Filled 2022-05-07: qty 3

## 2022-05-07 MED ORDER — METHYLPREDNISOLONE SODIUM SUCC 125 MG IJ SOLR
125.0000 mg | Freq: Once | INTRAMUSCULAR | Status: AC
  Administered 2022-05-07: 125 mg via INTRAVENOUS
  Filled 2022-05-07: qty 2

## 2022-05-07 MED ORDER — PREDNISONE 20 MG PO TABS: ORAL_TABLET | ORAL | 0 refills | Status: DC

## 2022-05-07 MED ORDER — POTASSIUM CHLORIDE CRYS ER 20 MEQ PO TBCR
40.0000 meq | EXTENDED_RELEASE_TABLET | Freq: Once | ORAL | Status: AC
Start: 2022-05-07 — End: 2022-05-07
  Administered 2022-05-07: 40 meq via ORAL
  Filled 2022-05-07: qty 2

## 2022-05-07 MED ORDER — AMOXICILLIN-POT CLAVULANATE 875-125 MG PO TABS
1.0000 | ORAL_TABLET | Freq: Once | ORAL | Status: AC
  Administered 2022-05-07: 1 via ORAL
  Filled 2022-05-07: qty 1

## 2022-05-07 MED ORDER — LACTATED RINGERS IV BOLUS
1000.0000 mL | Freq: Once | INTRAVENOUS | Status: AC
  Administered 2022-05-07: 1000 mL via INTRAVENOUS

## 2022-05-07 MED ORDER — EPINEPHRINE 0.3 MG/0.3ML IJ SOAJ
0.3000 mg | Freq: Once | INTRAMUSCULAR | Status: AC
Start: 2022-05-07 — End: 2022-05-07
  Administered 2022-05-07: 0.3 mg via INTRAMUSCULAR
  Filled 2022-05-07: qty 0.3

## 2022-05-07 MED ORDER — ACETAMINOPHEN 325 MG PO TABS
650.0000 mg | ORAL_TABLET | Freq: Once | ORAL | Status: AC
  Administered 2022-05-07: 650 mg via ORAL
  Filled 2022-05-07: qty 2

## 2022-05-07 NOTE — Discharge Instructions (Addendum)
It is unclear what caused your tongue swelling today.  You are being started on steroids which she can start tomorrow.  You can take Benadryl as well.  You also concomitantly have a gland infection/stone on the right side.  Apply warm compresses and you can use sour candies such as lemon drops and anti-inflammatory such as ibuprofen.  You are being prescribed antibiotics.  If at any point develop fever, trouble breathing or swallowing, new or worsening swelling, or any other new/concerning symptoms then return to the ER or call 911.  Follow-up with ENT, call the office on Monday, 7/31

## 2022-05-07 NOTE — ED Triage Notes (Signed)
Pt reports right sided tongue swelling that began within the last hour. Pt denies taking any new medications and is unsure what could be causing the swelling. Denies throat swelling and SHOB.

## 2022-05-07 NOTE — ED Provider Notes (Signed)
[facility] DEPT Provider Note   CSN: [id number] Arrival date & time: 05/07/22  1513     History  Chief Complaint  Patient presents with   Angioedema    Jacqueline Ryan is a 76 y.o. female.  HPI 76 year old female presents with acute tongue swelling.  The right side of her tongue has been swollen since about 1 hour prior to arrival.  Seems to be progressively worsening.  She feels like the rate of swelling decreased after she took Benadryl.  She denies any trouble breathing.  She was on an ARB when this occurred last time in 2022 and they took this off.  She states last time this happened they gave her epinephrine and it seemed to resolve the situation.  There was some itching last time but there is no itching this time.  This occurred shortly after eating though eating typical food she has eaten many times before.  Home Medications Prior to Admission medications   Medication Sig Start Date End Date Taking? Authorizing Provider  amoxicillin-clavulanate (AUGMENTIN) 875-125 MG tablet Take 1 tablet by mouth every 12 (twelve) hours. 05/07/22  Yes Sherwood Gambler, MD  aspirin 325 MG tablet Take 650 mg by mouth See admin instructions. Take 650 mg by mouth one to two times a day as needed for pain   Yes [provider]  Cholecalciferol (VITAMIN D3) 25 MCG (1000 UT) CAPS Take 1,000 Units by mouth daily with breakfast.   Yes [provider]  diclofenac Sodium (VOLTAREN) 1 % GEL Apply 2-4 g topically 2 (two) times daily as needed (to affected areas- for arthritic pain).   Yes [provider]  Histamine Dihydrochloride (AUSTRALIAN DREAM ARTHRITIS) 0.025 % CREA Apply 1 application  topically daily as needed (for arthritic pain).   Yes [provider]  hydrochlorothiazide (HYDRODIURIL) 25 MG tablet Take 25 mg by mouth daily.   Yes [provider]  HYDROcodone-acetaminophen (NORCO) 5-325 MG tablet Take 1-2 tablets by mouth every 6  (six) hours as needed for moderate pain. 12/31/21  Yes Netta Cedars, MD  Multiple Vitamins-Minerals (PRESERVISION AREDS 2) CAPS Take 1 capsule by mouth 2 (two) times daily.   Yes [provider]  Omega-3 Fatty Acids (FISH OIL) 1000 MG CAPS Take 2,000 mg by mouth daily.   Yes [provider]  predniSONE (DELTASONE) 20 MG tablet 2 tabs po daily x 4 days 05/07/22  Yes Sherwood Gambler, MD  vitamin B-12 (CYANOCOBALAMIN) 1000 MCG tablet Take 1,000 mcg by mouth daily.   Yes [provider]  methocarbamol (ROBAXIN) 500 MG tablet Take 1 tablet (500 mg total) by mouth every 8 (eight) hours as needed for muscle spasms. Patient not taking: Reported on 05/07/2022 12/31/21   Netta Cedars, MD  spironolactone (ALDACTONE) 25 MG tablet Take 25 mg by mouth daily. Patient not taking: Reported on 05/07/2022    [provider]  albuterol (VENTOLIN HFA) 108 (90 Base) MCG/ACT inhaler Inhale 1-2 puffs into the lungs every 6 (six) hours as needed for wheezing or shortness of breath. Patient not taking: No sig reported 07/26/19 01/19/21  Wyvonnia Dusky, MD  famotidine (PEPCID) 20 MG tablet Take 1 tablet (20 mg total) by mouth 2 (two) times daily for 7 days. Patient not taking: Reported on 01/15/2021 10/25/20 01/19/21  Sponseller, Eugene Garnet R, PA-C  ferrous sulfate (FERROUSUL) 325 (65 FE) MG tablet Take 1 tablet (325 mg total) by mouth 3 (three) times daily with meals for 14 days. Patient not taking:  Reported on 01/15/2021 07/24/19 01/19/21  Danae Orleans, PA-C      Allergies    Amlodipine, Losartan potassium, and Spironolactone    Review of Systems   Review of Systems  HENT:  Positive for facial swelling.   Respiratory:  Negative for shortness of breath.   Cardiovascular:  Negative for chest pain.    Physical Exam Updated Vital Signs BP (!) 170/92   Pulse 85   Temp 98.4 F (36.9 C) (Oral)   Resp 20   SpO2 96%  Physical Exam Vitals and nursing note reviewed.  Constitutional:       General: She is not in acute distress.    Appearance: She is well-developed. She is not ill-appearing or diaphoretic.  HENT:     Head: Normocephalic and atraumatic.     Mouth/Throat:     Comments: See picture. Sizable right sided tongue swelling. Change in voice due to this. No stridor or respiratory distress Cardiovascular:     Rate and Rhythm: Normal rate and regular rhythm.     Heart sounds: Normal heart sounds.  Pulmonary:     Effort: Pulmonary effort is normal.     Breath sounds: Normal breath sounds. No stridor. No wheezing.  Abdominal:     General: There is no distension.     Palpations: Abdomen is soft.  Skin:    General: Skin is warm and dry.  Neurological:     Mental Status: She is alert.      ED Results / Procedures / Treatments   Labs (all labs ordered are listed, but only abnormal results are displayed) Labs Reviewed  COMPREHENSIVE METABOLIC PANEL - Abnormal; Notable for the following components:      Result Value   Sodium 133 (*)    Potassium 3.3 (*)    Chloride 96 (*)    Glucose, Bld 127 (*)    All other components within normal limits  CBC WITH DIFFERENTIAL/PLATELET - Abnormal; Notable for the following components:   Hemoglobin 9.8 (*)    HCT 32.7 (*)    MCV 72.2 (*)    MCH 21.6 (*)    RDW 20.2 (*)    Monocytes Absolute 1.4 (*)    All other components within normal limits    EKG None  Radiology CT Soft Tissue Neck W Contrast  Result Date: 05/07/2022 CLINICAL DATA:  Epiglottitis or tonsillitis suspected. Soft tissue swelling. Right-sided tongue swelling. EXAM: CT NECK WITH CONTRAST TECHNIQUE: Multidetector CT imaging of the neck was performed using the standard protocol following the bolus administration of intravenous contrast. RADIATION DOSE REDUCTION: This exam was performed according to the departmental dose-optimization program which includes automated exposure control, adjustment of the mA and/or kV according to patient size and/or use of  iterative reconstruction technique. CONTRAST:  49m OMNIPAQUE IOHEXOL 300 MG/ML  SOLN COMPARISON:  None Available. FINDINGS: Pharynx and larynx: No focal mucosal or submucosal lesions are present. Nasopharynx is within normal limits. Soft palate and tongue base are normal. Edema is present throughout the tongue. Epiglottis is within normal limits. Aryepiglottic folds and piriform sinuses are clear. Vocal cords are midline and symmetric. Trachea is clear. Salivary glands: A 9.5 mm stone is present in the proximal right submandibular duct. Marked edema surrounds the right submandibular gland. Left submandibular gland within normal limits. The parotid glands and ducts are normal bilaterally. Thyroid: Normal Lymph nodes: Reactive is right level 2 lymph nodes are present. No significant cervical adenopathy is present. Vascular: Atherosclerotic calcifications are present the aortic arch  great vessel origins. Atherosclerotic calcifications are present at the carotid bifurcations bilaterally without significant stenosis. Limited intracranial: Within normal limits. Visualized orbits: Bilateral lens replacements are noted. Globes and orbits are otherwise unremarkable. Mastoids and visualized paranasal sinuses: The paranasal sinuses and mastoid air cells are clear. Skeleton: Multilevel degenerative changes are present. Ankylosis noted at C4-5. Grade 1 degenerative anterolisthesis is present at C2-3 and C3-4. No focal osseous lesions present. Upper chest: The lung apices are clear. Thoracic inlet is within normal limits. IMPRESSION: 1. 9.5 mm stone in the proximal right submandibular duct with marked surrounding inflammatory changes. 2. Reactive right level 2 lymph nodes. 3. No other inflammatory changes within the oropharynx or hypopharynx. 4. Multilevel degenerative changes in the cervical spine. 5. Aortic Atherosclerosis (ICD10-I70.0). Electronically Signed   By: San Morelle M.D.   On: 05/07/2022 18:01     Procedures .Critical Care  Performed by: Sherwood Gambler, MD Authorized by: Sherwood Gambler, MD   Critical care provider statement:    Critical care time (minutes):  30   Critical care time was exclusive of:  Separately billable procedures and treating other patients   Critical care was necessary to treat or prevent imminent or life-threatening deterioration of the following conditions:  Respiratory failure   Critical care was time spent personally by me on the following activities:  Development of treatment plan with patient or surrogate, discussions with consultants, evaluation of patient's response to treatment, examination of patient, ordering and review of laboratory studies, ordering and review of radiographic studies, ordering and performing treatments and interventions, pulse oximetry, re-evaluation of patient's condition and review of old charts     Medications Ordered in ED Medications  ibuprofen (ADVIL) tablet 600 mg (600 mg Oral Patient Refused/Not Given 05/07/22 1911)  lactated ringers bolus 1,000 mL (0 mLs Intravenous Stopped 05/07/22 1903)  EPINEPHrine (EPI-PEN) injection 0.3 mg (0.3 mg Intramuscular Given 05/07/22 1536)  methylPREDNISolone sodium succinate (SOLU-MEDROL) 125 mg/2 mL injection 125 mg (125 mg Intravenous Given 05/07/22 1539)  iohexol (OMNIPAQUE) 300 MG/ML solution 80 mL (80 mLs Intravenous Contrast Given 05/07/22 1724)  potassium chloride SA (KLOR-CON M) CR tablet 40 mEq (40 mEq Oral Given 05/07/22 1907)  amoxicillin-clavulanate (AUGMENTIN) 875-125 MG per tablet 1 tablet (1 tablet Oral Given 05/07/22 1907)  acetaminophen (TYLENOL) tablet 650 mg (650 mg Oral Given 05/07/22 1952)    ED Course/ Medical Decision Making/ A&P Clinical Course as of 05/07/22 2313  Sat May 07, 2022  1712 Tongue is the same if not slightly better. Patient tells me she's also had a few days of right mandibular gum/jaw soreness and thinks she might have an infection.  This did still occur  suddenly today, but we will do a CT to rule out deeper infection while we are observing her. [SG]    Clinical Course User Index [SG] Sherwood Gambler, MD                           Medical Decision Making Amount and/or Complexity of Data Reviewed Labs: ordered.    Details: Mild anemia compared to baseline.  Normal WBC.  Mild hypokalemia. Radiology: ordered and independent interpretation performed.    Details: No deep space abscess.  Has submandibular duct stone.  Risk OTC drugs. Prescription drug management.   It was not instant or quick but her tongue swelling has gradually improved over multiple hours in the emergency department.  Thus I am not really sure it was the epinephrine, though this  was given given she felt like it worked last time.  She was also given steroids and fluids.  Hypertension has improved though is creeping back up.  After the initial work-up as above she started discussing about right jaw pain.  CT shows submandibular stone.  I did discuss with on-call ENT and it would be very unlikely for this stone to be causing her tongue swelling so seems like she has 2 things going on.  Now noticing a little bit of redness to her jaw so we will put her on Augmentin.  Otherwise, symptoms have improved enough that she feels comfortable going home and has been observed in the ER for over 4 hours after her epinephrine.  Short burst of steroids.  Follow-up closely with PCP and ENT.        Final Clinical Impression(s) / ED Diagnoses Final diagnoses:  Angioedema, initial encounter  Submandibular sialoadenitis    Rx / DC Orders ED Discharge Orders          Ordered    predniSONE (DELTASONE) 20 MG tablet        05/07/22 1942    amoxicillin-clavulanate (AUGMENTIN) 875-125 MG tablet  Every 12 hours        05/07/22 1942              Sherwood Gambler, MD 05/07/22 2338

## 2022-05-10 DIAGNOSIS — M9901 Segmental and somatic dysfunction of cervical region: Secondary | ICD-10-CM | POA: Diagnosis not present

## 2022-05-10 DIAGNOSIS — K115 Sialolithiasis: Secondary | ICD-10-CM | POA: Diagnosis not present

## 2022-05-10 DIAGNOSIS — M5136 Other intervertebral disc degeneration, lumbar region: Secondary | ICD-10-CM | POA: Diagnosis not present

## 2022-05-10 DIAGNOSIS — M62830 Muscle spasm of back: Secondary | ICD-10-CM | POA: Diagnosis not present

## 2022-05-10 DIAGNOSIS — K1123 Chronic sialoadenitis: Secondary | ICD-10-CM | POA: Diagnosis not present

## 2022-05-10 DIAGNOSIS — M9903 Segmental and somatic dysfunction of lumbar region: Secondary | ICD-10-CM | POA: Diagnosis not present

## 2022-05-11 DIAGNOSIS — I700 Atherosclerosis of aorta: Secondary | ICD-10-CM | POA: Diagnosis not present

## 2022-05-11 DIAGNOSIS — I10 Essential (primary) hypertension: Secondary | ICD-10-CM | POA: Diagnosis not present

## 2022-05-11 DIAGNOSIS — M160 Bilateral primary osteoarthritis of hip: Secondary | ICD-10-CM | POA: Diagnosis not present

## 2022-09-13 DIAGNOSIS — M62830 Muscle spasm of back: Secondary | ICD-10-CM | POA: Diagnosis not present

## 2022-09-13 DIAGNOSIS — M9901 Segmental and somatic dysfunction of cervical region: Secondary | ICD-10-CM | POA: Diagnosis not present

## 2022-09-13 DIAGNOSIS — M5136 Other intervertebral disc degeneration, lumbar region: Secondary | ICD-10-CM | POA: Diagnosis not present

## 2022-09-13 DIAGNOSIS — M9903 Segmental and somatic dysfunction of lumbar region: Secondary | ICD-10-CM | POA: Diagnosis not present

## 2022-09-20 DIAGNOSIS — M5136 Other intervertebral disc degeneration, lumbar region: Secondary | ICD-10-CM | POA: Diagnosis not present

## 2022-09-20 DIAGNOSIS — M9901 Segmental and somatic dysfunction of cervical region: Secondary | ICD-10-CM | POA: Diagnosis not present

## 2022-09-20 DIAGNOSIS — M62830 Muscle spasm of back: Secondary | ICD-10-CM | POA: Diagnosis not present

## 2022-09-20 DIAGNOSIS — M9903 Segmental and somatic dysfunction of lumbar region: Secondary | ICD-10-CM | POA: Diagnosis not present

## 2022-10-14 DIAGNOSIS — I10 Essential (primary) hypertension: Secondary | ICD-10-CM | POA: Diagnosis not present

## 2022-10-14 DIAGNOSIS — T7840XA Allergy, unspecified, initial encounter: Secondary | ICD-10-CM | POA: Diagnosis not present

## 2022-10-18 DIAGNOSIS — L538 Other specified erythematous conditions: Secondary | ICD-10-CM | POA: Diagnosis not present

## 2022-10-18 DIAGNOSIS — T7840XD Allergy, unspecified, subsequent encounter: Secondary | ICD-10-CM | POA: Diagnosis not present

## 2022-10-18 DIAGNOSIS — I10 Essential (primary) hypertension: Secondary | ICD-10-CM | POA: Diagnosis not present

## 2022-10-27 DIAGNOSIS — I10 Essential (primary) hypertension: Secondary | ICD-10-CM | POA: Diagnosis not present

## 2022-10-27 DIAGNOSIS — M8580 Other specified disorders of bone density and structure, unspecified site: Secondary | ICD-10-CM | POA: Diagnosis not present

## 2022-10-27 DIAGNOSIS — J449 Chronic obstructive pulmonary disease, unspecified: Secondary | ICD-10-CM | POA: Diagnosis not present

## 2022-11-08 DIAGNOSIS — M62830 Muscle spasm of back: Secondary | ICD-10-CM | POA: Diagnosis not present

## 2022-11-08 DIAGNOSIS — M9903 Segmental and somatic dysfunction of lumbar region: Secondary | ICD-10-CM | POA: Diagnosis not present

## 2022-11-08 DIAGNOSIS — M5136 Other intervertebral disc degeneration, lumbar region: Secondary | ICD-10-CM | POA: Diagnosis not present

## 2022-11-08 DIAGNOSIS — M9901 Segmental and somatic dysfunction of cervical region: Secondary | ICD-10-CM | POA: Diagnosis not present

## 2022-11-11 DIAGNOSIS — L282 Other prurigo: Secondary | ICD-10-CM | POA: Diagnosis not present

## 2022-11-11 DIAGNOSIS — J449 Chronic obstructive pulmonary disease, unspecified: Secondary | ICD-10-CM | POA: Diagnosis not present

## 2022-11-11 DIAGNOSIS — I700 Atherosclerosis of aorta: Secondary | ICD-10-CM | POA: Diagnosis not present

## 2022-11-11 DIAGNOSIS — Z1231 Encounter for screening mammogram for malignant neoplasm of breast: Secondary | ICD-10-CM | POA: Diagnosis not present

## 2022-11-11 DIAGNOSIS — M8580 Other specified disorders of bone density and structure, unspecified site: Secondary | ICD-10-CM | POA: Diagnosis not present

## 2022-11-11 DIAGNOSIS — I10 Essential (primary) hypertension: Secondary | ICD-10-CM | POA: Diagnosis not present

## 2022-11-11 DIAGNOSIS — D582 Other hemoglobinopathies: Secondary | ICD-10-CM | POA: Diagnosis not present

## 2022-11-11 DIAGNOSIS — Z0000 Encounter for general adult medical examination without abnormal findings: Secondary | ICD-10-CM | POA: Diagnosis not present

## 2022-11-16 ENCOUNTER — Other Ambulatory Visit (HOSPITAL_BASED_OUTPATIENT_CLINIC_OR_DEPARTMENT_OTHER): Payer: Self-pay | Admitting: Family Medicine

## 2022-11-16 ENCOUNTER — Telehealth (HOSPITAL_BASED_OUTPATIENT_CLINIC_OR_DEPARTMENT_OTHER): Payer: Self-pay

## 2022-11-16 DIAGNOSIS — Z1231 Encounter for screening mammogram for malignant neoplasm of breast: Secondary | ICD-10-CM

## 2022-11-17 ENCOUNTER — Other Ambulatory Visit (HOSPITAL_BASED_OUTPATIENT_CLINIC_OR_DEPARTMENT_OTHER): Payer: Self-pay | Admitting: Family Medicine

## 2022-11-17 DIAGNOSIS — M62830 Muscle spasm of back: Secondary | ICD-10-CM | POA: Diagnosis not present

## 2022-11-17 DIAGNOSIS — M8580 Other specified disorders of bone density and structure, unspecified site: Secondary | ICD-10-CM

## 2022-11-17 DIAGNOSIS — M9901 Segmental and somatic dysfunction of cervical region: Secondary | ICD-10-CM | POA: Diagnosis not present

## 2022-11-17 DIAGNOSIS — M5136 Other intervertebral disc degeneration, lumbar region: Secondary | ICD-10-CM | POA: Diagnosis not present

## 2022-11-17 DIAGNOSIS — M9903 Segmental and somatic dysfunction of lumbar region: Secondary | ICD-10-CM | POA: Diagnosis not present

## 2022-11-24 DIAGNOSIS — M5136 Other intervertebral disc degeneration, lumbar region: Secondary | ICD-10-CM | POA: Diagnosis not present

## 2022-11-24 DIAGNOSIS — M9901 Segmental and somatic dysfunction of cervical region: Secondary | ICD-10-CM | POA: Diagnosis not present

## 2022-11-24 DIAGNOSIS — M9903 Segmental and somatic dysfunction of lumbar region: Secondary | ICD-10-CM | POA: Diagnosis not present

## 2022-11-24 DIAGNOSIS — M62830 Muscle spasm of back: Secondary | ICD-10-CM | POA: Diagnosis not present

## 2022-11-25 DIAGNOSIS — L309 Dermatitis, unspecified: Secondary | ICD-10-CM | POA: Diagnosis not present

## 2022-11-25 DIAGNOSIS — J4489 Other specified chronic obstructive pulmonary disease: Secondary | ICD-10-CM | POA: Diagnosis not present

## 2022-11-25 DIAGNOSIS — Z9181 History of falling: Secondary | ICD-10-CM | POA: Diagnosis not present

## 2022-11-25 DIAGNOSIS — F419 Anxiety disorder, unspecified: Secondary | ICD-10-CM | POA: Diagnosis not present

## 2022-11-25 DIAGNOSIS — R269 Unspecified abnormalities of gait and mobility: Secondary | ICD-10-CM | POA: Diagnosis not present

## 2022-11-25 DIAGNOSIS — Z008 Encounter for other general examination: Secondary | ICD-10-CM | POA: Diagnosis not present

## 2022-11-25 DIAGNOSIS — R69 Illness, unspecified: Secondary | ICD-10-CM | POA: Diagnosis not present

## 2022-11-25 DIAGNOSIS — I739 Peripheral vascular disease, unspecified: Secondary | ICD-10-CM | POA: Diagnosis not present

## 2022-11-25 DIAGNOSIS — R32 Unspecified urinary incontinence: Secondary | ICD-10-CM | POA: Diagnosis not present

## 2022-11-25 DIAGNOSIS — Z809 Family history of malignant neoplasm, unspecified: Secondary | ICD-10-CM | POA: Diagnosis not present

## 2022-11-25 DIAGNOSIS — Z87891 Personal history of nicotine dependence: Secondary | ICD-10-CM | POA: Diagnosis not present

## 2022-11-25 DIAGNOSIS — I129 Hypertensive chronic kidney disease with stage 1 through stage 4 chronic kidney disease, or unspecified chronic kidney disease: Secondary | ICD-10-CM | POA: Diagnosis not present

## 2022-11-25 DIAGNOSIS — M1990 Unspecified osteoarthritis, unspecified site: Secondary | ICD-10-CM | POA: Diagnosis not present

## 2022-11-25 DIAGNOSIS — L509 Urticaria, unspecified: Secondary | ICD-10-CM | POA: Diagnosis not present

## 2022-12-05 DIAGNOSIS — L299 Pruritus, unspecified: Secondary | ICD-10-CM | POA: Diagnosis not present

## 2022-12-05 DIAGNOSIS — D509 Iron deficiency anemia, unspecified: Secondary | ICD-10-CM | POA: Diagnosis not present

## 2022-12-05 DIAGNOSIS — L309 Dermatitis, unspecified: Secondary | ICD-10-CM | POA: Diagnosis not present

## 2022-12-13 DIAGNOSIS — M62830 Muscle spasm of back: Secondary | ICD-10-CM | POA: Diagnosis not present

## 2022-12-13 DIAGNOSIS — M9903 Segmental and somatic dysfunction of lumbar region: Secondary | ICD-10-CM | POA: Diagnosis not present

## 2022-12-13 DIAGNOSIS — M9901 Segmental and somatic dysfunction of cervical region: Secondary | ICD-10-CM | POA: Diagnosis not present

## 2022-12-13 DIAGNOSIS — M5136 Other intervertebral disc degeneration, lumbar region: Secondary | ICD-10-CM | POA: Diagnosis not present

## 2022-12-13 IMAGING — DX DG SHOULDER 1V*L*
1 series · 1 of 1 positions shown · non-contrast
Comparison: 07/26/2019

CLINICAL DATA: Left shoulder arthroplasty

EXAM:
LEFT SHOULDER

[shoulder ap]
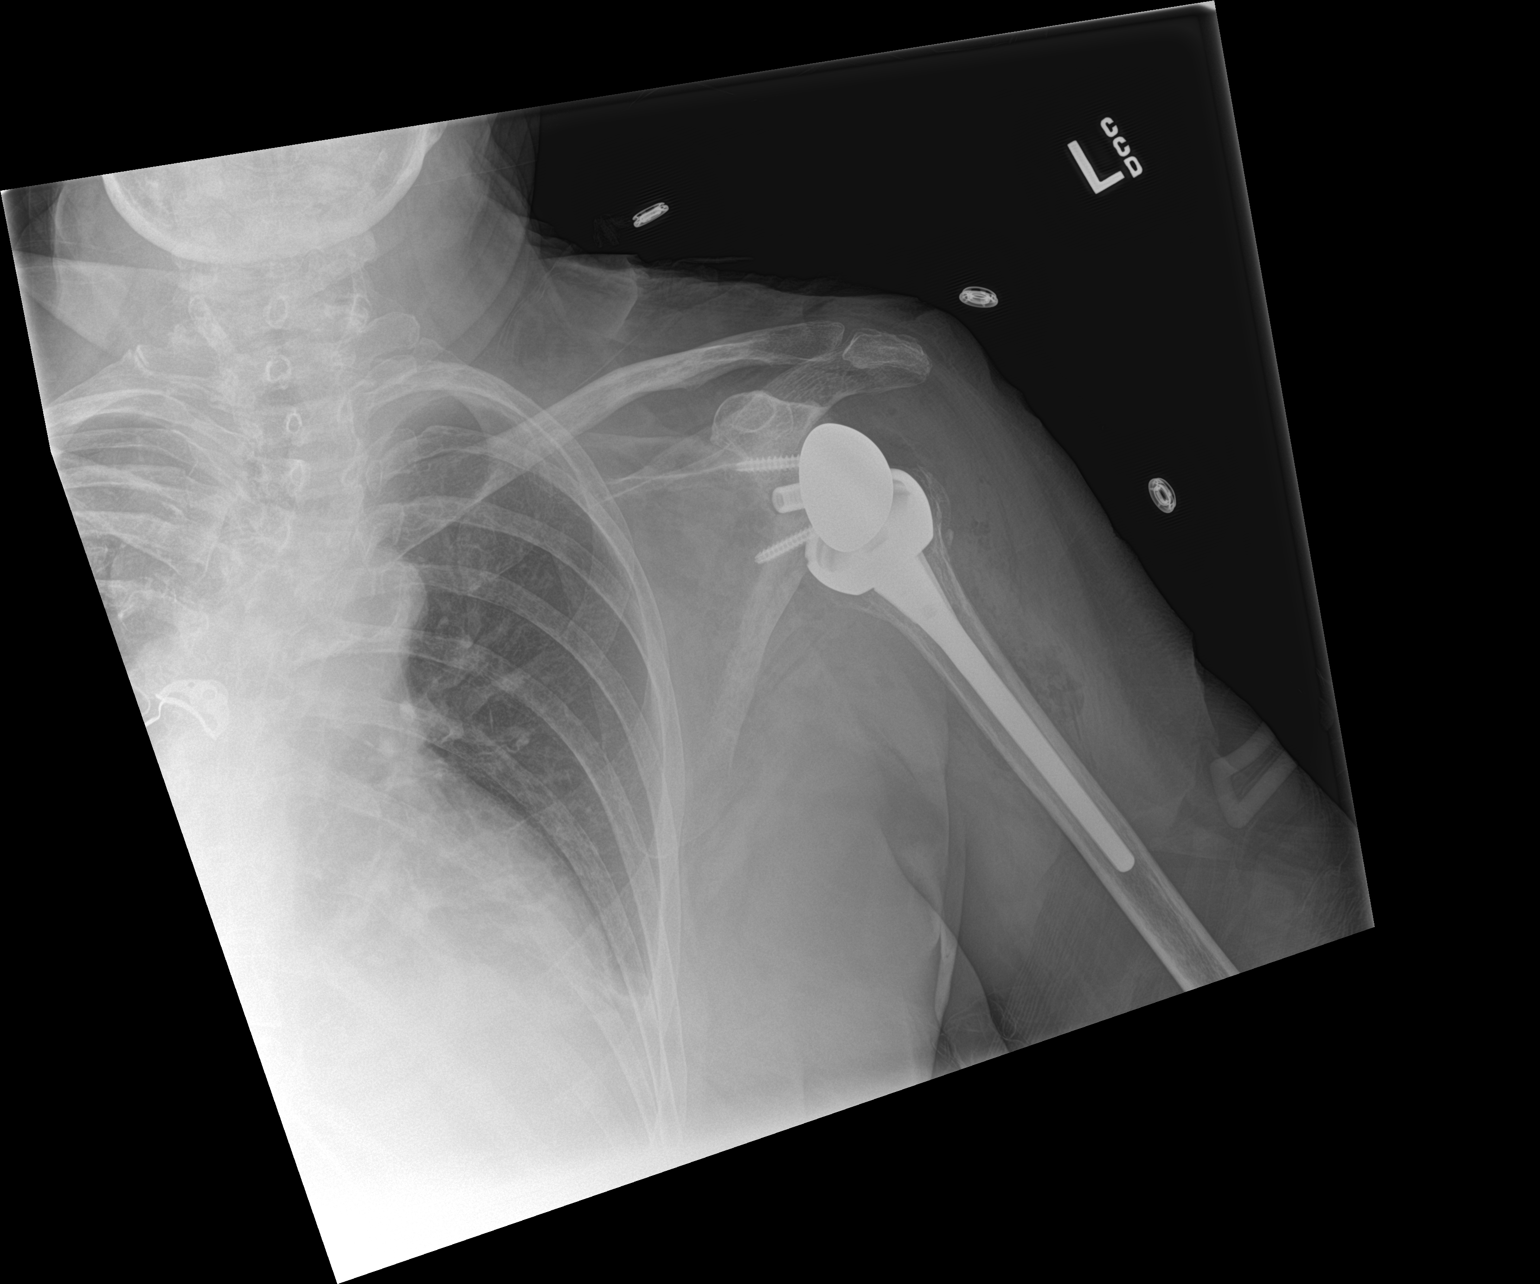

[1 of 1 positions shown; findings below may reference images not displayed]

FINDINGS: Interval postsurgical changes from reverse left shoulder
arthroplasty. Arthroplasty components are in their expected
alignment. No periprosthetic fracture or evidence of other
complication. Expected postoperative changes within the overlying
soft tissues.
IMPRESSION: Satisfactory postoperative appearance status post reverse left
shoulder arthroplasty.

## 2022-12-14 ENCOUNTER — Telehealth: Payer: Self-pay | Admitting: Nurse Practitioner

## 2022-12-14 NOTE — Telephone Encounter (Signed)
scheduled per 3/6 referral , pt has been called and confirmed date and time. Pt is aware of location and to arrive early for check in

## 2022-12-15 DIAGNOSIS — D509 Iron deficiency anemia, unspecified: Secondary | ICD-10-CM | POA: Diagnosis not present

## 2022-12-21 ENCOUNTER — Inpatient Hospital Stay (HOSPITAL_BASED_OUTPATIENT_CLINIC_OR_DEPARTMENT_OTHER): Admission: RE | Admit: 2022-12-21 | Payer: Medicare HMO | Source: Ambulatory Visit

## 2022-12-21 ENCOUNTER — Other Ambulatory Visit (HOSPITAL_BASED_OUTPATIENT_CLINIC_OR_DEPARTMENT_OTHER): Payer: Medicare HMO

## 2022-12-22 DIAGNOSIS — D485 Neoplasm of uncertain behavior of skin: Secondary | ICD-10-CM | POA: Diagnosis not present

## 2022-12-22 DIAGNOSIS — L239 Allergic contact dermatitis, unspecified cause: Secondary | ICD-10-CM | POA: Diagnosis not present

## 2022-12-22 DIAGNOSIS — L259 Unspecified contact dermatitis, unspecified cause: Secondary | ICD-10-CM | POA: Diagnosis not present

## 2022-12-22 DIAGNOSIS — L298 Other pruritus: Secondary | ICD-10-CM | POA: Diagnosis not present

## 2022-12-22 DIAGNOSIS — D492 Neoplasm of unspecified behavior of bone, soft tissue, and skin: Secondary | ICD-10-CM | POA: Diagnosis not present

## 2022-12-22 DIAGNOSIS — C44529 Squamous cell carcinoma of skin of other part of trunk: Secondary | ICD-10-CM | POA: Diagnosis not present

## 2022-12-22 DIAGNOSIS — L309 Dermatitis, unspecified: Secondary | ICD-10-CM | POA: Diagnosis not present

## 2022-12-22 DIAGNOSIS — R208 Other disturbances of skin sensation: Secondary | ICD-10-CM | POA: Diagnosis not present

## 2022-12-27 DIAGNOSIS — M62830 Muscle spasm of back: Secondary | ICD-10-CM | POA: Diagnosis not present

## 2022-12-27 DIAGNOSIS — M9901 Segmental and somatic dysfunction of cervical region: Secondary | ICD-10-CM | POA: Diagnosis not present

## 2022-12-27 DIAGNOSIS — M9903 Segmental and somatic dysfunction of lumbar region: Secondary | ICD-10-CM | POA: Diagnosis not present

## 2022-12-27 DIAGNOSIS — M5136 Other intervertebral disc degeneration, lumbar region: Secondary | ICD-10-CM | POA: Diagnosis not present

## 2022-12-29 ENCOUNTER — Inpatient Hospital Stay: Payer: Medicare HMO | Attending: Nurse Practitioner | Admitting: Hematology and Oncology

## 2022-12-29 ENCOUNTER — Inpatient Hospital Stay: Payer: Medicare HMO

## 2023-01-03 DIAGNOSIS — I10 Essential (primary) hypertension: Secondary | ICD-10-CM | POA: Diagnosis not present

## 2023-01-03 DIAGNOSIS — J40 Bronchitis, not specified as acute or chronic: Secondary | ICD-10-CM | POA: Diagnosis not present

## 2023-01-03 DIAGNOSIS — Q254 Congenital malformation of aorta unspecified: Secondary | ICD-10-CM | POA: Diagnosis not present

## 2023-01-03 DIAGNOSIS — D509 Iron deficiency anemia, unspecified: Secondary | ICD-10-CM | POA: Diagnosis not present

## 2023-01-13 ENCOUNTER — Inpatient Hospital Stay: Payer: Medicare HMO | Attending: Nurse Practitioner | Admitting: Hematology and Oncology

## 2023-01-13 ENCOUNTER — Inpatient Hospital Stay: Payer: Medicare HMO

## 2023-01-13 VITALS — BP 154/82 | HR 105 | Temp 97.8°F | Resp 18 | Ht 64.0 in | Wt 159.2 lb

## 2023-01-13 DIAGNOSIS — I10 Essential (primary) hypertension: Secondary | ICD-10-CM | POA: Diagnosis not present

## 2023-01-13 DIAGNOSIS — D509 Iron deficiency anemia, unspecified: Secondary | ICD-10-CM | POA: Diagnosis not present

## 2023-01-13 DIAGNOSIS — Z87891 Personal history of nicotine dependence: Secondary | ICD-10-CM | POA: Insufficient documentation

## 2023-01-13 LAB — RETICULOCYTES
Immature Retic Fract: 17.7 % — ABNORMAL HIGH (ref 2.3–15.9)
RBC.: 5.68 MIL/uL — ABNORMAL HIGH (ref 3.87–5.11)
Retic Count, Absolute: 96 10*3/uL (ref 19.0–186.0)
Retic Ct Pct: 1.7 % (ref 0.4–3.1)

## 2023-01-13 LAB — CBC WITH DIFFERENTIAL/PLATELET
Abs Immature Granulocytes: 0.02 10*3/uL (ref 0.00–0.07)
Basophils Absolute: 0.1 10*3/uL (ref 0.0–0.1)
Basophils Relative: 1 %
Eosinophils Absolute: 0.8 10*3/uL — ABNORMAL HIGH (ref 0.0–0.5)
Eosinophils Relative: 10 %
HCT: 41.1 % (ref 36.0–46.0)
Hemoglobin: 12.7 g/dL (ref 12.0–15.0)
Immature Granulocytes: 0 %
Lymphocytes Relative: 13 %
Lymphs Abs: 1 10*3/uL (ref 0.7–4.0)
MCH: 22.7 pg — ABNORMAL LOW (ref 26.0–34.0)
MCHC: 30.9 g/dL (ref 30.0–36.0)
MCV: 73.5 fL — ABNORMAL LOW (ref 80.0–100.0)
Monocytes Absolute: 0.8 10*3/uL (ref 0.1–1.0)
Monocytes Relative: 10 %
Neutro Abs: 5.2 10*3/uL (ref 1.7–7.7)
Neutrophils Relative %: 66 %
Platelets: 334 10*3/uL (ref 150–400)
RBC: 5.59 MIL/uL — ABNORMAL HIGH (ref 3.87–5.11)
Smear Review: NORMAL
WBC: 7.8 10*3/uL (ref 4.0–10.5)
nRBC: 0 % (ref 0.0–0.2)

## 2023-01-13 LAB — CMP (CANCER CENTER ONLY)
ALT: 15 U/L (ref 0–44)
AST: 18 U/L (ref 15–41)
Albumin: 4.2 g/dL (ref 3.5–5.0)
Alkaline Phosphatase: 94 U/L (ref 38–126)
Anion gap: 8 (ref 5–15)
BUN: 10 mg/dL (ref 8–23)
CO2: 28 mmol/L (ref 22–32)
Calcium: 9.5 mg/dL (ref 8.9–10.3)
Chloride: 98 mmol/L (ref 98–111)
Creatinine: 0.61 mg/dL (ref 0.44–1.00)
GFR, Estimated: >60 mL/min (ref >60)
Glucose, Bld: 117 mg/dL — ABNORMAL HIGH (ref 70–99)
Potassium: 4 mmol/L (ref 3.5–5.1)
Sodium: 134 mmol/L — ABNORMAL LOW (ref 135–145)
Total Bilirubin: 0.7 mg/dL (ref 0.3–1.2)
Total Protein: 7.4 g/dL (ref 6.5–8.1)

## 2023-01-13 LAB — IRON AND IRON BINDING CAPACITY (CC-WL,HP ONLY)
Iron: 27 ug/dL — ABNORMAL LOW (ref 28–170)
Saturation Ratios: 6 % — ABNORMAL LOW (ref 10.4–31.8)
TIBC: 438 ug/dL (ref 250–450)
UIBC: 411 ug/dL (ref 148–442)

## 2023-01-13 LAB — VITAMIN B12: Vitamin B-12: 444 pg/mL (ref 180–914)

## 2023-01-13 LAB — FERRITIN: Ferritin: 18 ng/mL (ref 11–307)

## 2023-01-13 LAB — LACTATE DEHYDROGENASE: LDH: 193 U/L — ABNORMAL HIGH (ref 98–192)

## 2023-01-13 NOTE — Progress Notes (Signed)
[facility] CONSULT NOTE  Patient Care Team: Mitzi Hansenaniel, Janice, NP as PCP - General (Nurse Practitioner)  CHIEF COMPLAINTS/PURPOSE OF CONSULTATION:  IDA  ASSESSMENT & PLAN:   This is a very pleasant 77 year old female patient with newly diagnosed iron deficiency anemia currently on oral iron supplementation referred to hematology for additional recommendations and consideration for iron infusion.  Patient has been taking oral iron supplementation for the past 4 weeks and has been tolerating it remarkably well.  She tells me that her fatigue has significantly improved, pica has improved.  She denies any shortness of breath on exertion or dizziness.  No hematochezia or melena.  Last colonoscopy was done at [facility] last year with no major concerns.  She cannot remember if she ever had an endoscopy.  Physical examination today, she appears well, no major concerns.  CBC from today shows remarkable improvement in hemoglobin to over 12 g/dL, iron panel reviewed, ferritin pending.  At this time since she is not clinically symptomatic and her hemoglobin is about 12 g/dL, I recommended that she continue oral iron supplementation and return to clinic in 8 weeks.  She should also consider discussing with her gastroenterologist if she needs another procedure to evaluate for possible blood loss anemia.  She expressed understanding.  All questions were answered to the best my knowledge.  Thank you for consulting us in the care of this patient.  HISTORY OF PRESENTING ILLNESS:  Jacqueline Ryan 77 y.o. female is here because of IDA  This is a very pleasant 77 year old female patient with past medical history significant for hypertension, arthritis referred to hematology for microcytic hypochromic anemia.  She recently went to her primary care physician who noticed that her hemoglobin was around 8 g/dL with microcytic hypochromic picture, ferritin of 4.9 consistent with iron deficiency anemia.   There was no clear source of bleeding.  Patient had a colonoscopy last year with no major concerning findings.  She denies any hematochezia or melena. She denies any macroscopic hematuria. She denies any stomach surgeries or history of malabsorption. She does have history of hiatal hernia. She quit smoking about 3 months ago.  She denies any history of iron deficiency anemia or need for iron infusions.  No family history of colon cancer.  She tells me that she started getting sick after COVID vaccination, had this rash which was unexplained went to multiple dermatologist and finally settling down after about 3 months.  Other than the symptoms, she feels quite well even from the anemia.  She used to feel tired but she does not anymore.  No more pica.  She denies any shortness of breath on exertion or dizziness.  Rest of the pertinent 10 point ROS reviewed and negative  MEDICAL HISTORY:  Past Medical History:  Diagnosis Date   Anxiety    Arthritis    Hepatitis    age 77   Hypertension     SURGICAL HISTORY: Past Surgical History:  Procedure Laterality Date   ABDOMINAL HYSTERECTOMY     age 77   EYE SURGERY Bilateral    REVERSE SHOULDER ARTHROPLASTY Left 12/31/2021   Procedure: REVERSE SHOULDER ARTHROPLASTY;  Surgeon: Beverely LowNorris, Steve, MD;  Location: [facility];  Service: Orthopedics;  Laterality: Left;  interscalene block   TONSILLECTOMY     as a child   TOTAL HIP ARTHROPLASTY Left 07/23/2019   Procedure: TOTAL HIP ARTHROPLASTY ANTERIOR APPROACH;  Surgeon: Durene Romanslin, Matthew, MD;  Location: [facility];  Service: Orthopedics;  Laterality: Left;  70 mins    SOCIAL HISTORY: Social History   Socioeconomic History   Marital status: Married    Spouse name: Not on file   Number of children: Not on file   Years of education: Not on file   Highest education level: Not on file  Occupational History   Not on file  Tobacco Use   Smoking status: Former    Packs/day: 1.00    Years: 15.00    Additional pack  years: 0.00    Total pack years: 15.00    Types: Cigarettes    Quit date: 03/29/2020    Years since quitting: 2.7   Smokeless tobacco: Never  Vaping Use   Vaping Use: Never used  Substance and Sexual Activity   Alcohol use: Yes    Alcohol/week: 9.0 standard drinks of alcohol    Types: 9 Cans of beer per week    Comment: daily beer   Drug use: Never   Sexual activity: Not on file  Other Topics Concern   Not on file  Social History Narrative   Not on file   Social Determinants of Health   Financial Resource Strain: Not on file  Food Insecurity: Not on file  Transportation Needs: Not on file  Physical Activity: Not on file  Stress: Not on file  Social Connections: Not on file  Intimate Partner Violence: Not on file    FAMILY HISTORY: No family history on file.  ALLERGIES:  is allergic to amlodipine, losartan potassium, and spironolactone.  MEDICATIONS:  Current Outpatient Medications  Medication Sig Dispense Refill   aspirin 325 MG tablet Take 650 mg by mouth See admin instructions. Take 650 mg by mouth one to two times a day as needed for pain     Cholecalciferol (VITAMIN D3) 25 MCG (1000 UT) CAPS Take 1,000 Units by mouth daily with breakfast.     diclofenac Sodium (VOLTAREN) 1 % GEL Apply 2-4 g topically 2 (two) times daily as needed (to affected areas- for arthritic pain).     Histamine Dihydrochloride (AUSTRALIAN DREAM ARTHRITIS) 0.025 % CREA Apply 1 application  topically daily as needed (for arthritic pain).     hydrochlorothiazide (HYDRODIURIL) 25 MG tablet Take 25 mg by mouth daily.     Multiple Vitamins-Minerals (PRESERVISION AREDS 2) CAPS Take 1 capsule by mouth 2 (two) times daily.     Omega-3 Fatty Acids (FISH OIL) 1000 MG CAPS Take 2,000 mg by mouth daily.     vitamin B-12 (CYANOCOBALAMIN) 1000 MCG tablet Take 1,000 mcg by mouth daily.     No current facility-administered medications for this visit.     PHYSICAL EXAMINATION: ECOG PERFORMANCE STATUS: 0 -  Asymptomatic  Vitals:   01/13/23 1007  BP: (!) 154/82  Pulse: (!) 105  Resp: 18  Temp: 97.8 F (36.6 C)  SpO2: 98%   Filed Weights   01/13/23 1007  Weight: 159 lb 3.2 oz (72.2 kg)    GENERAL:alert, no distress and comfortable SKIN: skin color, texture, turgor are normal, no rashes or significant lesions EYES: normal, conjunctiva are pink and non-injected, sclera clear OROPHARYNX:no exudate, no erythema and lips, buccal mucosa, and tongue normal  NECK: supple, thyroid normal size, non-tender, without nodularity LYMPH:  no palpable lymphadenopathy in the cervical, axillary  LUNGS: clear to auscultation and percussion with normal breathing effort HEART: regular rate & rhythm and no murmurs and no lower extremity edema ABDOMEN:abdomen soft, non-tender and normal bowel sounds Musculoskeletal:no cyanosis of digits and no clubbing  PSYCH:  alert & oriented x 3 with fluent speech NEURO: no focal motor/sensory deficits  LABORATORY DATA:  I have reviewed the data as listed Lab Results  Component Value Date   WBC 7.8 01/13/2023   HGB 12.7 01/13/2023   HCT 41.1 01/13/2023   MCV 73.5 (L) 01/13/2023   PLT 334 01/13/2023     Chemistry      Component Value Date/Time   NA 134 (L) 01/13/2023 1048   K 4.0 01/13/2023 1048   CL 98 01/13/2023 1048   CO2 28 01/13/2023 1048   BUN 10 01/13/2023 1048   CREATININE 0.61 01/13/2023 1048      Component Value Date/Time   CALCIUM 9.5 01/13/2023 1048   ALKPHOS 94 01/13/2023 1048   AST 18 01/13/2023 1048   ALT 15 01/13/2023 1048   BILITOT 0.7 01/13/2023 1048       RADIOGRAPHIC STUDIES: I have personally reviewed the radiological images as listed and agreed with the findings in the report. No results found.  All questions were answered. The patient knows to call the clinic with any problems, questions or concerns. I spent 45 minutes in the care of this patient including H and P, review of records, counseling and coordination of care.      Rachel MouldsPraveena Delaine Hernandez, MD 01/13/2023 3:43 PM

## 2023-01-16 ENCOUNTER — Telehealth: Payer: Self-pay | Admitting: Hematology and Oncology

## 2023-01-16 LAB — FOLATE RBC
Folate, Hemolysate: >620 ng/mL
Folate, RBC: >1406 ng/mL (ref >498)
Hematocrit: 44.1 % (ref 34.0–46.6)

## 2023-01-16 NOTE — Telephone Encounter (Signed)
Spoke with patient confirming upcoming appointment  

## 2023-01-18 ENCOUNTER — Ambulatory Visit (HOSPITAL_BASED_OUTPATIENT_CLINIC_OR_DEPARTMENT_OTHER): Payer: Medicare HMO

## 2023-01-18 ENCOUNTER — Encounter (HOSPITAL_BASED_OUTPATIENT_CLINIC_OR_DEPARTMENT_OTHER): Payer: Self-pay

## 2023-01-18 ENCOUNTER — Other Ambulatory Visit (HOSPITAL_BASED_OUTPATIENT_CLINIC_OR_DEPARTMENT_OTHER): Payer: Medicare HMO

## 2023-01-24 ENCOUNTER — Ambulatory Visit (HOSPITAL_BASED_OUTPATIENT_CLINIC_OR_DEPARTMENT_OTHER): Payer: Medicare HMO

## 2023-01-31 ENCOUNTER — Ambulatory Visit (HOSPITAL_BASED_OUTPATIENT_CLINIC_OR_DEPARTMENT_OTHER)
Admission: RE | Admit: 2023-01-31 | Discharge: 2023-01-31 | Disposition: A | Discharge status: Home / Self Care | Payer: Medicare HMO | Source: Ambulatory Visit | Attending: Family Medicine | Admitting: Family Medicine

## 2023-01-31 ENCOUNTER — Encounter (HOSPITAL_BASED_OUTPATIENT_CLINIC_OR_DEPARTMENT_OTHER): Payer: Self-pay

## 2023-01-31 ENCOUNTER — Ambulatory Visit (HOSPITAL_BASED_OUTPATIENT_CLINIC_OR_DEPARTMENT_OTHER)
Admission: RE | Admit: 2023-01-31 | Discharge: 2023-01-31 | Disposition: A | Discharge status: Home / Self Care | Payer: Medicare HMO | Source: Ambulatory Visit | Attending: Nurse Practitioner | Admitting: Nurse Practitioner

## 2023-01-31 DIAGNOSIS — M8589 Other specified disorders of bone density and structure, multiple sites: Secondary | ICD-10-CM | POA: Insufficient documentation

## 2023-01-31 DIAGNOSIS — Z780 Asymptomatic menopausal state: Secondary | ICD-10-CM | POA: Insufficient documentation

## 2023-01-31 DIAGNOSIS — M1612 Unilateral primary osteoarthritis, left hip: Secondary | ICD-10-CM | POA: Insufficient documentation

## 2023-01-31 DIAGNOSIS — Z96642 Presence of left artificial hip joint: Secondary | ICD-10-CM | POA: Diagnosis not present

## 2023-01-31 DIAGNOSIS — Z13820 Encounter for screening for osteoporosis: Secondary | ICD-10-CM | POA: Diagnosis not present

## 2023-01-31 DIAGNOSIS — M8580 Other specified disorders of bone density and structure, unspecified site: Secondary | ICD-10-CM

## 2023-01-31 DIAGNOSIS — Z1231 Encounter for screening mammogram for malignant neoplasm of breast: Secondary | ICD-10-CM | POA: Insufficient documentation

## 2023-02-07 DIAGNOSIS — M9903 Segmental and somatic dysfunction of lumbar region: Secondary | ICD-10-CM | POA: Diagnosis not present

## 2023-02-07 DIAGNOSIS — M5136 Other intervertebral disc degeneration, lumbar region: Secondary | ICD-10-CM | POA: Diagnosis not present

## 2023-02-07 DIAGNOSIS — M9901 Segmental and somatic dysfunction of cervical region: Secondary | ICD-10-CM | POA: Diagnosis not present

## 2023-02-07 DIAGNOSIS — M62830 Muscle spasm of back: Secondary | ICD-10-CM | POA: Diagnosis not present

## 2023-02-15 DIAGNOSIS — H353113 Nonexudative age-related macular degeneration, right eye, advanced atrophic without subfoveal involvement: Secondary | ICD-10-CM | POA: Diagnosis not present

## 2023-02-15 DIAGNOSIS — H353123 Nonexudative age-related macular degeneration, left eye, advanced atrophic without subfoveal involvement: Secondary | ICD-10-CM | POA: Diagnosis not present

## 2023-02-24 DIAGNOSIS — L299 Pruritus, unspecified: Secondary | ICD-10-CM | POA: Diagnosis not present

## 2023-02-24 DIAGNOSIS — J300 Vasomotor rhinitis: Secondary | ICD-10-CM | POA: Diagnosis not present

## 2023-02-24 DIAGNOSIS — R21 Rash and other nonspecific skin eruption: Secondary | ICD-10-CM | POA: Diagnosis not present

## 2023-02-24 DIAGNOSIS — J3089 Other allergic rhinitis: Secondary | ICD-10-CM | POA: Diagnosis not present

## 2023-02-27 DIAGNOSIS — H43813 Vitreous degeneration, bilateral: Secondary | ICD-10-CM | POA: Diagnosis not present

## 2023-02-27 DIAGNOSIS — H353133 Nonexudative age-related macular degeneration, bilateral, advanced atrophic without subfoveal involvement: Secondary | ICD-10-CM | POA: Diagnosis not present

## 2023-02-27 DIAGNOSIS — H35033 Hypertensive retinopathy, bilateral: Secondary | ICD-10-CM | POA: Diagnosis not present

## 2023-02-27 DIAGNOSIS — Z961 Presence of intraocular lens: Secondary | ICD-10-CM | POA: Diagnosis not present

## 2023-02-28 DIAGNOSIS — M9901 Segmental and somatic dysfunction of cervical region: Secondary | ICD-10-CM | POA: Diagnosis not present

## 2023-02-28 DIAGNOSIS — M62830 Muscle spasm of back: Secondary | ICD-10-CM | POA: Diagnosis not present

## 2023-02-28 DIAGNOSIS — M9903 Segmental and somatic dysfunction of lumbar region: Secondary | ICD-10-CM | POA: Diagnosis not present

## 2023-02-28 DIAGNOSIS — L239 Allergic contact dermatitis, unspecified cause: Secondary | ICD-10-CM | POA: Diagnosis not present

## 2023-02-28 DIAGNOSIS — D509 Iron deficiency anemia, unspecified: Secondary | ICD-10-CM | POA: Diagnosis not present

## 2023-02-28 DIAGNOSIS — M5136 Other intervertebral disc degeneration, lumbar region: Secondary | ICD-10-CM | POA: Diagnosis not present

## 2023-03-02 DIAGNOSIS — M62830 Muscle spasm of back: Secondary | ICD-10-CM | POA: Diagnosis not present

## 2023-03-02 DIAGNOSIS — M9901 Segmental and somatic dysfunction of cervical region: Secondary | ICD-10-CM | POA: Diagnosis not present

## 2023-03-02 DIAGNOSIS — M5136 Other intervertebral disc degeneration, lumbar region: Secondary | ICD-10-CM | POA: Diagnosis not present

## 2023-03-02 DIAGNOSIS — M9903 Segmental and somatic dysfunction of lumbar region: Secondary | ICD-10-CM | POA: Diagnosis not present

## 2023-03-08 DIAGNOSIS — L03114 Cellulitis of left upper limb: Secondary | ICD-10-CM | POA: Diagnosis not present

## 2023-03-08 DIAGNOSIS — L299 Pruritus, unspecified: Secondary | ICD-10-CM | POA: Diagnosis not present

## 2023-03-10 ENCOUNTER — Telehealth: Payer: Self-pay | Admitting: Hematology and Oncology

## 2023-03-10 NOTE — Telephone Encounter (Signed)
Patient called and left a voicemail saying they wanted to cancel their appointment and only wanting to follow up with their PCP. Patient is aware that appointment has been cancelled.

## 2023-03-13 ENCOUNTER — Ambulatory Visit: Payer: Medicare HMO | Admitting: Hematology and Oncology

## 2023-03-23 DIAGNOSIS — M9903 Segmental and somatic dysfunction of lumbar region: Secondary | ICD-10-CM | POA: Diagnosis not present

## 2023-03-23 DIAGNOSIS — M5136 Other intervertebral disc degeneration, lumbar region: Secondary | ICD-10-CM | POA: Diagnosis not present

## 2023-03-23 DIAGNOSIS — M62830 Muscle spasm of back: Secondary | ICD-10-CM | POA: Diagnosis not present

## 2023-03-23 DIAGNOSIS — M9901 Segmental and somatic dysfunction of cervical region: Secondary | ICD-10-CM | POA: Diagnosis not present

## 2023-03-31 ENCOUNTER — Other Ambulatory Visit: Payer: Self-pay

## 2023-03-31 DIAGNOSIS — I700 Atherosclerosis of aorta: Secondary | ICD-10-CM

## 2023-04-03 ENCOUNTER — Emergency Department (HOSPITAL_BASED_OUTPATIENT_CLINIC_OR_DEPARTMENT_OTHER)
Admission: EM | Admit: 2023-04-03 | Discharge: 2023-04-03 | Disposition: A | Discharge status: Home / Self Care | Payer: Medicare HMO | Attending: Emergency Medicine | Admitting: Emergency Medicine

## 2023-04-03 ENCOUNTER — Encounter: Payer: Self-pay | Admitting: Vascular Surgery

## 2023-04-03 ENCOUNTER — Other Ambulatory Visit: Payer: Self-pay

## 2023-04-03 ENCOUNTER — Other Ambulatory Visit (HOSPITAL_BASED_OUTPATIENT_CLINIC_OR_DEPARTMENT_OTHER): Payer: Self-pay

## 2023-04-03 DIAGNOSIS — R21 Rash and other nonspecific skin eruption: Secondary | ICD-10-CM | POA: Diagnosis not present

## 2023-04-03 DIAGNOSIS — B3749 Other urogenital candidiasis: Secondary | ICD-10-CM

## 2023-04-03 DIAGNOSIS — E663 Overweight: Secondary | ICD-10-CM | POA: Diagnosis not present

## 2023-04-03 DIAGNOSIS — D8989 Other specified disorders involving the immune mechanism, not elsewhere classified: Secondary | ICD-10-CM | POA: Diagnosis not present

## 2023-04-03 DIAGNOSIS — I10 Essential (primary) hypertension: Secondary | ICD-10-CM | POA: Diagnosis not present

## 2023-04-03 DIAGNOSIS — B3731 Acute candidiasis of vulva and vagina: Secondary | ICD-10-CM | POA: Insufficient documentation

## 2023-04-03 DIAGNOSIS — L299 Pruritus, unspecified: Secondary | ICD-10-CM | POA: Diagnosis not present

## 2023-04-03 DIAGNOSIS — Z79899 Other long term (current) drug therapy: Secondary | ICD-10-CM | POA: Insufficient documentation

## 2023-04-03 DIAGNOSIS — Z7982 Long term (current) use of aspirin: Secondary | ICD-10-CM | POA: Insufficient documentation

## 2023-04-03 DIAGNOSIS — M6281 Muscle weakness (generalized): Secondary | ICD-10-CM | POA: Diagnosis not present

## 2023-04-03 DIAGNOSIS — R5383 Other fatigue: Secondary | ICD-10-CM | POA: Diagnosis not present

## 2023-04-03 DIAGNOSIS — M79642 Pain in left hand: Secondary | ICD-10-CM | POA: Diagnosis not present

## 2023-04-03 DIAGNOSIS — M79641 Pain in right hand: Secondary | ICD-10-CM | POA: Diagnosis not present

## 2023-04-03 DIAGNOSIS — Z6827 Body mass index (BMI) 27.0-27.9, adult: Secondary | ICD-10-CM | POA: Diagnosis not present

## 2023-04-03 DIAGNOSIS — R296 Repeated falls: Secondary | ICD-10-CM | POA: Diagnosis not present

## 2023-04-03 LAB — CBC
HCT: 48.8 % — ABNORMAL HIGH (ref 36.0–46.0)
Hemoglobin: 15.9 g/dL — ABNORMAL HIGH (ref 12.0–15.0)
MCH: 29.2 pg (ref 26.0–34.0)
MCHC: 32.6 g/dL (ref 30.0–36.0)
MCV: 89.7 fL (ref 80.0–100.0)
Platelets: 276 10*3/uL (ref 150–400)
RBC: 5.44 MIL/uL — ABNORMAL HIGH (ref 3.87–5.11)
RDW: 18.4 % — ABNORMAL HIGH (ref 11.5–15.5)
WBC: 8.5 10*3/uL (ref 4.0–10.5)
nRBC: 0 % (ref 0.0–0.2)

## 2023-04-03 LAB — BASIC METABOLIC PANEL
Anion gap: 11 (ref 5–15)
BUN: 8 mg/dL (ref 8–23)
CO2: 25 mmol/L (ref 22–32)
Calcium: 9.2 mg/dL (ref 8.9–10.3)
Chloride: 101 mmol/L (ref 98–111)
Creatinine, Ser: 0.47 mg/dL (ref 0.44–1.00)
GFR, Estimated: >60 mL/min (ref >60)
Glucose, Bld: 98 mg/dL (ref 70–99)
Potassium: 3.9 mmol/L (ref 3.5–5.1)
Sodium: 137 mmol/L (ref 135–145)

## 2023-04-03 MED ORDER — NYSTATIN 100000 UNIT/GM EX CREA: TOPICAL_CREAM | CUTANEOUS | 0 refills | Status: AC

## 2023-04-03 MED ORDER — HYDROCHLOROTHIAZIDE 12.5 MG PO TABS: 12.5000 mg | ORAL_TABLET | Freq: Every day | ORAL | 1 refills | Status: AC

## 2023-04-03 NOTE — Discharge Instructions (Signed)
The blood work looks good.  Start on 12.5 of hydrochlorothiazide for next 2 weeks and if blood pressure is still high then increase to 25mg  each day.  Also you can use the cream for the yeast in your groin area.  If you develop chest pain, shortness of breath, trouble speaking or moving 1 side of your body and severe headache return to the ER.

## 2023-04-03 NOTE — ED Triage Notes (Signed)
Patient here POV from Home.  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 Instructed to seek ED Assessment for 220/100 BP. Recently stopped taking Spironolactone  a few months ago.   No CP. No SOB. Mild Headache. No Vision Changes. No Fevers. No N/V.   NAD Noted during triage. A&Ox4. GCS 15. Ambulatory.

## 2023-04-03 NOTE — ED Notes (Signed)
Provider at bedside

## 2023-04-03 NOTE — ED Provider Notes (Signed)
[place] EMERGENCY DEPARTMENT AT [facility] Provider Note   CSN: [id number] Arrival date & time: 04/03/23  1243     History  Chief Complaint  Patient presents with   Hypertension    Jacqueline Ryan is a 77 y.o. female.  Patient is a 77 year old female with a history of hypertension, arthritis who presents today from the rheumatologist office due to elevated blood pressure.  However on history of the patient since December she has had an ongoing issue with a rash of undetermined etiology.  It has moved from higher in her back now down to her right buttocks.  She is seen dermatologist, PCP, allergist and then was seeing a rheumatologist for this today.  She 3 weeks ago was started on gabapentin which she reports is finally improved some of her symptoms.  She did not feel like there were any new symptoms today but she was seeing the rheumatologist for this ongoing rash.  Approximately 6 months ago she was taken off all of her blood pressure medication including the hydrochlorothiazide and spironolactone because they were concerned that might have been the cause for the rash.  She has been evaluating her blood pressure at home and reports it has been running between 140-160 regularly as well as when she goes to doctors appointments.  She reports a long term history of hypertension.  She also reports that last night she and her significant other were in an argument and she drank too much and she has not eaten anything today and thinks that is also a possible cause of why her blood pressure is more elevated than normal.  She denies any visual changes, chest pain, nausea vomiting, shortness of breath.  No exertional symptoms.  No difficulty thinking speaking or unilateral numbness or weakness.  She does complain of a mild headache which she reports is from a hangover.  Secondly she for the last 2 weeks has noticed a rash in her groin area after she was given a course of amoxicillin that has been  itchy and uncomfortable.  It is different than the rash she has had ongoing.  She denies any urinary symptoms.  The history is provided by the patient.  Hypertension       Home Medications Prior to Admission medications   Medication Sig Start Date End Date Taking? Authorizing Provider  hydrochlorothiazide (HYDRODIURIL) 12.5 MG tablet Take 1 tablet (12.5 mg total) by mouth daily. 04/03/23  Yes Jaine Estabrooks, Alphonzo Lemmings, MD  nystatin cream (MYCOSTATIN) Apply to affected area 2 times daily 04/03/23  Yes Gwyneth Sprout, MD  aspirin 325 MG tablet Take 650 mg by mouth See admin instructions. Take 650 mg by mouth one to two times a day as needed for pain    [provider]  Cholecalciferol (VITAMIN D3) 25 MCG (1000 UT) CAPS Take 1,000 Units by mouth daily with breakfast.    [provider]  diclofenac Sodium (VOLTAREN) 1 % GEL Apply 2-4 g topically 2 (two) times daily as needed (to affected areas- for arthritic pain).    [provider]  Histamine Dihydrochloride (AUSTRALIAN DREAM ARTHRITIS) 0.025 % CREA Apply 1 application  topically daily as needed (for arthritic pain).    [provider]  hydrochlorothiazide (HYDRODIURIL) 25 MG tablet Take 25 mg by mouth daily.    [provider]  Multiple Vitamins-Minerals (PRESERVISION AREDS 2) CAPS Take 1 capsule by mouth 2 (two) times daily.    [provider]  Omega-3 Fatty Acids (FISH OIL) 1000 MG  CAPS Take 2,000 mg by mouth daily.    [provider]  vitamin B-12 (CYANOCOBALAMIN) 1000 MCG tablet Take 1,000 mcg by mouth daily.    [provider]  ferrous sulfate (FERROUSUL) 325 (65 FE) MG tablet Take 1 tablet (325 mg total) by mouth 3 (three) times daily with meals for 14 days. Patient not taking: Reported on 01/15/2021 07/24/19 01/19/21  Lanney Gins, PA-C      Allergies    Amlodipine, Losartan potassium, and Spironolactone    Review of Systems   Review of Systems  Physical  Exam Updated Vital Signs BP (!) 211/114 (BP Location: Right Arm)   Pulse (!) 108   Temp 98.1 F (36.7 C)   Resp 19   Ht 5\' 4"  (1.626 m)   Wt 70.8 kg   SpO2 98%   BMI 26.78 kg/m  Physical Exam Vitals and nursing note reviewed.  Constitutional:      General: She is not in acute distress.    Appearance: She is well-developed.  HENT:     Head: Normocephalic and atraumatic.  Eyes:     Conjunctiva/sclera: Conjunctivae normal.     Pupils: Pupils are equal, round, and reactive to light.  Cardiovascular:     Rate and Rhythm: Regular rhythm. Tachycardia present.     Pulses: Normal pulses.     Heart sounds: No murmur heard. Pulmonary:     Effort: Pulmonary effort is normal. No respiratory distress.     Breath sounds: Normal breath sounds. No wheezing or rales.  Abdominal:     General: There is no distension.     Palpations: Abdomen is soft.     Tenderness: There is no abdominal tenderness. There is no guarding or rebound.  Musculoskeletal:        General: No tenderness. Normal range of motion.     Cervical back: Normal range of motion and neck supple.     Right lower leg: No edema.     Left lower leg: No edema.  Skin:    General: Skin is warm and dry.     Findings: Rash present. No erythema.     Comments: In the inguinal area there is a beefy red rash with some minimal maceration of the tissue and some satellite lesions.  This is different than the papular blanching rash on her right buttocks and hip area.  No vesicles or weeping noted.  Neurological:     Mental Status: She is alert and oriented to person, place, and time.  Psychiatric:        Behavior: Behavior normal.     ED Results / Procedures / Treatments   Labs (all labs ordered are listed, but only abnormal results are displayed) Labs Reviewed  CBC - Abnormal; Notable for the following components:      Result Value   RBC 5.44 (*)    Hemoglobin 15.9 (*)    HCT 48.8 (*)    RDW 18.4 (*)    All other components  within normal limits  BASIC METABOLIC PANEL    EKG EKG Interpretation  Date/Time:  Monday April 03 2023 12:56:23 EDT Ventricular Rate:  93 PR Interval:  188 QRS Duration: 64 QT Interval:  362 QTC Calculation: 450 R Axis:   109 Text Interpretation: Normal sinus rhythm new Rightward axis Cannot rule out Anterior infarct , age undetermined When compared with ECG of 01-Nov-2021 11:43, QRS axis Shifted right Confirmed by Gwyneth Sprout ([id number]) on 04/03/2023 3:08:30 PM  Radiology No results found.  Procedures Procedures    Medications Ordered in ED Medications - No data to display  ED Course/ Medical Decision Making/ A&P                             Medical Decision Making Amount and/or Complexity of Data Reviewed External Data Reviewed: notes. Labs: ordered. Decision-making details documented in ED Course. ECG/medicine tests: ordered and independent interpretation performed. Decision-making details documented in ED Course.  Risk Prescription drug management.   Pt with multiple medical problems and comorbidities and presenting today with a complaint that caries a high risk for morbidity and mortality.  Here today due to elevated blood pressure while at rheumatologist appointment.  Patient has a history of hypertension and is now been off blood pressure medication for 6 months.  She denies any symptoms concerning for hypertensive urgency at this time.  She has no chest pain, shortness of breath, visual changes, unilateral numbness weakness.  I independently interpreted patient's labs and she has no evidence of anemia with hemoglobin of 15 that would be causing her elevated blood pressure as well as a normal BMP without evidence of renal abnormalities.  Suspect that patient has uncontrolled hypertension because she is no longer on any of her medications.  Low suspicion that her hydrochlorothiazide is the cause of the rash she has been experiencing and she has been checking her blood  pressure at home regularly with her cuff that has been checked with her PCP cuff and is similar.  Feel that she needs to start back on the hydrochlorothiazide will start at 12.5 and have patient increase if blood pressure remains elevated in 2 weeks.  Also encouraged her to follow-up with PCP in 1 week to have her potassium rechecked but she reports in the past she did not have an issue with low potassium while on the medication.  EKG with some mild rightward axis deviation but no other acute changes.  Patient is not describing symptoms concerning for ACS or PE.  Patient was mildly tachycardic here but looking back over numerous doctors visits that is baseline.  She has no cough congestion or pulmonary complaints at this time.  No findings concerning for stroke.  If symptoms continue after started blood pressure medication patient may need TSH levels.  Unclear if PCP has checked this recently. Secondly patient is complaining of a new rash in her groin area that started after a course of antibiotics when her PCP felt that she may have cellulitis.  This rash is consistent with Candida and patient was given nystatin cream.  All the findings discussed with the patient and her husband.  At this time patient does not require admission and appears stable for discharge.  After resting heart rate is now 86.  Repeat blood pressure here by myself was 207/95.        Final Clinical Impression(s) / ED Diagnoses Final diagnoses:  Uncontrolled hypertension  Candida infection of genital region    Rx / DC Orders ED Discharge Orders          Ordered    nystatin cream (MYCOSTATIN)        04/03/23 1537    hydrochlorothiazide (HYDRODIURIL) 12.5 MG tablet  Daily        04/03/23 1537              Gwyneth Sprout, MD 04/03/23 1600

## 2023-04-03 NOTE — ED Notes (Signed)
Reviewed AVS/discharge instruction with patient. Time allotted for and all questions answered. Patient is agreeable for d/c and escorted to ed exit by staff.  

## 2023-04-18 DIAGNOSIS — M5136 Other intervertebral disc degeneration, lumbar region: Secondary | ICD-10-CM | POA: Diagnosis not present

## 2023-04-18 DIAGNOSIS — M9901 Segmental and somatic dysfunction of cervical region: Secondary | ICD-10-CM | POA: Diagnosis not present

## 2023-04-18 DIAGNOSIS — M9903 Segmental and somatic dysfunction of lumbar region: Secondary | ICD-10-CM | POA: Diagnosis not present

## 2023-04-18 DIAGNOSIS — M62830 Muscle spasm of back: Secondary | ICD-10-CM | POA: Diagnosis not present

## 2023-04-19 ENCOUNTER — Other Ambulatory Visit: Payer: Medicare HMO

## 2023-04-26 ENCOUNTER — Ambulatory Visit
Admission: RE | Admit: 2023-04-26 | Discharge: 2023-04-26 | Disposition: A | Discharge status: Home / Self Care | Payer: Medicare HMO | Source: Ambulatory Visit | Attending: Vascular Surgery | Admitting: Vascular Surgery

## 2023-04-26 DIAGNOSIS — I700 Atherosclerosis of aorta: Secondary | ICD-10-CM

## 2023-04-26 DIAGNOSIS — M5136 Other intervertebral disc degeneration, lumbar region: Secondary | ICD-10-CM | POA: Diagnosis not present

## 2023-04-26 DIAGNOSIS — K760 Fatty (change of) liver, not elsewhere classified: Secondary | ICD-10-CM | POA: Diagnosis not present

## 2023-04-26 DIAGNOSIS — K449 Diaphragmatic hernia without obstruction or gangrene: Secondary | ICD-10-CM | POA: Diagnosis not present

## 2023-04-26 MED ORDER — IOPAMIDOL (ISOVUE-370) INJECTION 76%
80.0000 mL | Freq: Once | INTRAVENOUS | Status: AC | PRN
  Administered 2023-04-26: 80 mL via INTRAVENOUS

## 2023-04-27 ENCOUNTER — Encounter: Payer: Self-pay | Admitting: Vascular Surgery

## 2023-04-27 ENCOUNTER — Ambulatory Visit: Payer: Medicare HMO | Admitting: Vascular Surgery

## 2023-04-27 VITALS — BP 193/79 | HR 82 | Temp 97.7°F | Resp 18 | Ht 64.0 in | Wt 160.0 lb

## 2023-04-27 DIAGNOSIS — I700 Atherosclerosis of aorta: Secondary | ICD-10-CM | POA: Diagnosis not present

## 2023-04-27 NOTE — Progress Notes (Signed)
REASON FOR VISIT:   Follow-up of ulcerated plaque of infrarenal aorta  MEDICAL ISSUES:   ULCERATED PLAQUE INFRARENAL AORTA: The ulcerated plaque of the infrarenal aorta looks exactly the same to me as it did a year ago.  This is asymptomatic.  I think we can stretch her follow-up out to 18 months.  I have ordered a CT angio of the abdomen pelvis at that time.  I have explained that I will be retiring so she will be seen on the PA schedule at that time.  She knows to call sooner if she has problems.  HYPERTENSION: The patient's blood pressure was elevated in the office this morning.  She takes her blood pressure at home and states that this morning it was 140/70.  HPI:   Jacqueline Ryan is a pleasant 77 y.o. female who I last saw on 10/14/2021.  She had been referred with an irregularity in her infrarenal aorta.  This was an incidental finding.  It was felt that this could be consistent with a small penetrating ulcer versus an atherosclerotic plaque.  On my review of the films, I felt that it look like an ulcerated plaque.  I recommended a follow-up CT scan in 1 year.  Since I saw her last, she denies any significant abdominal pain.  She does have chronic back pain.  She denies any claudication or rest pain.  She tells me that she takes her blood pressure at home and her blood pressure has been under good control.  Past Medical History:  Diagnosis Date   Anxiety    Arthritis    Hepatitis    age 23   Hypertension     No family history on file.  SOCIAL HISTORY: Social History   Tobacco Use   Smoking status: Former    Current packs/day: 0.00    Average packs/day: 1 pack/day for 15.0 years (15.0 ttl pk-yrs)    Types: Cigarettes    Start date: 03/29/2005    Quit date: 03/29/2020    Years since quitting: 3.0   Smokeless tobacco: Never  Substance Use Topics   Alcohol use: Yes    Alcohol/week: 9.0 standard drinks of alcohol    Types: 9 Cans of beer per week    Comment: daily beer     Allergies  Allergen Reactions   Amlodipine Swelling and Other (See Comments)    Ankles and lips swell with 10 mg    Losartan Potassium Swelling and Other (See Comments)    Angioedema    Spironolactone Hives, Swelling and Other (See Comments)    Lips swell    Current Outpatient Medications  Medication Sig Dispense Refill   aspirin 325 MG tablet Take 650 mg by mouth See admin instructions. Take 650 mg by mouth one to two times a day as needed for pain     Cholecalciferol (VITAMIN D3) 25 MCG (1000 UT) CAPS Take 1,000 Units by mouth daily with breakfast.     diclofenac Sodium (VOLTAREN) 1 % GEL Apply 2-4 g topically 2 (two) times daily as needed (to affected areas- for arthritic pain).     ferrous sulfate 324 MG TBEC Take 324 mg by mouth daily with breakfast. Per patient     gabapentin (NEURONTIN) 300 MG capsule Take 300 mg by mouth at bedtime.     Histamine Dihydrochloride (AUSTRALIAN DREAM ARTHRITIS) 0.025 % CREA Apply 1 application  topically daily as needed (for arthritic pain).     hydrochlorothiazide (HYDRODIURIL) 12.5 MG tablet  Take 1 tablet (12.5 mg total) by mouth daily. 60 tablet 1   Multiple Vitamins-Minerals (PRESERVISION AREDS 2) CAPS Take 1 capsule by mouth 2 (two) times daily.     nystatin cream (MYCOSTATIN) Apply to affected area 2 times daily 30 g 0   Omega-3 Fatty Acids (FISH OIL) 1000 MG CAPS Take 2,000 mg by mouth daily.     vitamin B-12 (CYANOCOBALAMIN) 1000 MCG tablet Take 1,000 mcg by mouth daily.     hydrochlorothiazide (HYDRODIURIL) 25 MG tablet Take 25 mg by mouth daily. (Patient not taking: Reported on 04/27/2023)     No current facility-administered medications for this visit.    REVIEW OF SYSTEMS:  [X]  denotes positive finding, [ ]  denotes negative finding Cardiac  Comments:  Chest pain or chest pressure:    Shortness of breath upon exertion:    Short of breath when lying flat:    Irregular heart rhythm:        Vascular    Pain in calf, thigh, or  hip brought on by ambulation:    Pain in feet at night that wakes you up from your sleep:     Blood clot in your veins:    Leg swelling:         Pulmonary    Oxygen at home:    Productive cough:     Wheezing:         Neurologic    Sudden weakness in arms or legs:     Sudden numbness in arms or legs:     Sudden onset of difficulty speaking or slurred speech:    Temporary loss of vision in one eye:     Problems with dizziness:         Gastrointestinal    Blood in stool:     Vomited blood:         Genitourinary    Burning when urinating:     Blood in urine:        Psychiatric    Major depression:         Hematologic    Bleeding problems:    Problems with blood clotting too easily:        Skin    Rashes or ulcers:        Constitutional    Fever or chills:     PHYSICAL EXAM:   Vitals:   04/27/23 1054  BP: (!) 193/79  Pulse: 82  Resp: 18  Temp: 97.7 F (36.5 C)  TempSrc: Temporal  SpO2: (!) 88%  Weight: 160 lb (72.6 kg)  Height: 5\' 4"  (1.626 m)    GENERAL: The patient is a well-nourished female, in no acute distress. The vital signs are documented above. CARDIAC: There is a regular rate and rhythm.  VASCULAR: I do not detect carotid bruits. She has palpable femoral pulses.  Both feet are warm and well-perfused.  She has no significant lower extremity swelling. PULMONARY: There is good air exchange bilaterally without wheezing or rales. ABDOMEN: Soft and non-tender with normal pitched bowel sounds.  MUSCULOSKELETAL: There are no major deformities or cyanosis. NEUROLOGIC: No focal weakness or paresthesias are detected. SKIN: There are no ulcers or rashes noted. PSYCHIATRIC: The patient has a normal affect.  DATA:    CT ANGIOGRAM ABDOMEN PELVIS: I have reviewed the images of the CT angiogram of the abdomen and pelvis.  This is not yet been read by radiology.  The area of concern looks exactly the same to me as it did  a year ago.  I placed the picture below and  this can be compared to my previous note.     Waverly Ferrari [facility] [phone]

## 2023-05-02 DIAGNOSIS — L299 Pruritus, unspecified: Secondary | ICD-10-CM | POA: Diagnosis not present

## 2023-05-02 DIAGNOSIS — M79642 Pain in left hand: Secondary | ICD-10-CM | POA: Diagnosis not present

## 2023-05-02 DIAGNOSIS — M6281 Muscle weakness (generalized): Secondary | ICD-10-CM | POA: Diagnosis not present

## 2023-05-02 DIAGNOSIS — E663 Overweight: Secondary | ICD-10-CM | POA: Diagnosis not present

## 2023-05-02 DIAGNOSIS — M79641 Pain in right hand: Secondary | ICD-10-CM | POA: Diagnosis not present

## 2023-05-02 DIAGNOSIS — Z6827 Body mass index (BMI) 27.0-27.9, adult: Secondary | ICD-10-CM | POA: Diagnosis not present

## 2023-05-02 DIAGNOSIS — R5383 Other fatigue: Secondary | ICD-10-CM | POA: Diagnosis not present

## 2023-05-02 DIAGNOSIS — R21 Rash and other nonspecific skin eruption: Secondary | ICD-10-CM | POA: Diagnosis not present

## 2023-05-04 DIAGNOSIS — I10 Essential (primary) hypertension: Secondary | ICD-10-CM | POA: Diagnosis not present

## 2023-05-04 DIAGNOSIS — M160 Bilateral primary osteoarthritis of hip: Secondary | ICD-10-CM | POA: Diagnosis not present

## 2023-05-04 DIAGNOSIS — L309 Dermatitis, unspecified: Secondary | ICD-10-CM | POA: Diagnosis not present

## 2023-10-24 DIAGNOSIS — M9905 Segmental and somatic dysfunction of pelvic region: Secondary | ICD-10-CM | POA: Diagnosis not present

## 2023-10-24 DIAGNOSIS — M9904 Segmental and somatic dysfunction of sacral region: Secondary | ICD-10-CM | POA: Diagnosis not present

## 2023-10-24 DIAGNOSIS — M9903 Segmental and somatic dysfunction of lumbar region: Secondary | ICD-10-CM | POA: Diagnosis not present

## 2023-10-24 DIAGNOSIS — M9901 Segmental and somatic dysfunction of cervical region: Secondary | ICD-10-CM | POA: Diagnosis not present

## 2024-02-01 DIAGNOSIS — M9905 Segmental and somatic dysfunction of pelvic region: Secondary | ICD-10-CM | POA: Diagnosis not present

## 2024-02-01 DIAGNOSIS — M9903 Segmental and somatic dysfunction of lumbar region: Secondary | ICD-10-CM | POA: Diagnosis not present

## 2024-02-01 DIAGNOSIS — M9901 Segmental and somatic dysfunction of cervical region: Secondary | ICD-10-CM | POA: Diagnosis not present

## 2024-02-01 DIAGNOSIS — M9904 Segmental and somatic dysfunction of sacral region: Secondary | ICD-10-CM | POA: Diagnosis not present

## 2024-02-02 DIAGNOSIS — I719 Aortic aneurysm of unspecified site, without rupture: Secondary | ICD-10-CM | POA: Diagnosis not present

## 2024-02-02 DIAGNOSIS — Z888 Allergy status to other drugs, medicaments and biological substances status: Secondary | ICD-10-CM | POA: Diagnosis not present

## 2024-02-02 DIAGNOSIS — F17210 Nicotine dependence, cigarettes, uncomplicated: Secondary | ICD-10-CM | POA: Diagnosis not present

## 2024-02-02 DIAGNOSIS — Z833 Family history of diabetes mellitus: Secondary | ICD-10-CM | POA: Diagnosis not present

## 2024-02-02 DIAGNOSIS — Z7982 Long term (current) use of aspirin: Secondary | ICD-10-CM | POA: Diagnosis not present

## 2024-02-02 DIAGNOSIS — I10 Essential (primary) hypertension: Secondary | ICD-10-CM | POA: Diagnosis not present

## 2024-02-02 DIAGNOSIS — Z809 Family history of malignant neoplasm, unspecified: Secondary | ICD-10-CM | POA: Diagnosis not present

## 2024-02-02 DIAGNOSIS — J309 Allergic rhinitis, unspecified: Secondary | ICD-10-CM | POA: Diagnosis not present

## 2024-02-02 DIAGNOSIS — Z008 Encounter for other general examination: Secondary | ICD-10-CM | POA: Diagnosis not present

## 2024-02-02 DIAGNOSIS — F419 Anxiety disorder, unspecified: Secondary | ICD-10-CM | POA: Diagnosis not present

## 2024-02-02 DIAGNOSIS — M545 Low back pain, unspecified: Secondary | ICD-10-CM | POA: Diagnosis not present

## 2024-02-02 DIAGNOSIS — M1990 Unspecified osteoarthritis, unspecified site: Secondary | ICD-10-CM | POA: Diagnosis not present

## 2024-02-02 DIAGNOSIS — R269 Unspecified abnormalities of gait and mobility: Secondary | ICD-10-CM | POA: Diagnosis not present

## 2024-02-08 DIAGNOSIS — M9904 Segmental and somatic dysfunction of sacral region: Secondary | ICD-10-CM | POA: Diagnosis not present

## 2024-02-08 DIAGNOSIS — M9901 Segmental and somatic dysfunction of cervical region: Secondary | ICD-10-CM | POA: Diagnosis not present

## 2024-02-08 DIAGNOSIS — M9905 Segmental and somatic dysfunction of pelvic region: Secondary | ICD-10-CM | POA: Diagnosis not present

## 2024-02-08 DIAGNOSIS — M9903 Segmental and somatic dysfunction of lumbar region: Secondary | ICD-10-CM | POA: Diagnosis not present

## 2024-05-14 DIAGNOSIS — Z03818 Encounter for observation for suspected exposure to other biological agents ruled out: Secondary | ICD-10-CM | POA: Diagnosis not present

## 2024-05-14 DIAGNOSIS — R051 Acute cough: Secondary | ICD-10-CM | POA: Diagnosis not present

## 2024-05-14 DIAGNOSIS — R5383 Other fatigue: Secondary | ICD-10-CM | POA: Diagnosis not present

## 2024-05-14 DIAGNOSIS — R197 Diarrhea, unspecified: Secondary | ICD-10-CM | POA: Diagnosis not present

## 2024-05-14 DIAGNOSIS — R52 Pain, unspecified: Secondary | ICD-10-CM | POA: Diagnosis not present

## 2024-05-22 DIAGNOSIS — M9901 Segmental and somatic dysfunction of cervical region: Secondary | ICD-10-CM | POA: Diagnosis not present

## 2024-05-22 DIAGNOSIS — M9903 Segmental and somatic dysfunction of lumbar region: Secondary | ICD-10-CM | POA: Diagnosis not present

## 2024-05-22 DIAGNOSIS — M9905 Segmental and somatic dysfunction of pelvic region: Secondary | ICD-10-CM | POA: Diagnosis not present

## 2024-05-22 DIAGNOSIS — M9904 Segmental and somatic dysfunction of sacral region: Secondary | ICD-10-CM | POA: Diagnosis not present

## 2024-05-29 DIAGNOSIS — M9901 Segmental and somatic dysfunction of cervical region: Secondary | ICD-10-CM | POA: Diagnosis not present

## 2024-05-29 DIAGNOSIS — M9905 Segmental and somatic dysfunction of pelvic region: Secondary | ICD-10-CM | POA: Diagnosis not present

## 2024-05-29 DIAGNOSIS — M9904 Segmental and somatic dysfunction of sacral region: Secondary | ICD-10-CM | POA: Diagnosis not present

## 2024-05-29 DIAGNOSIS — M9903 Segmental and somatic dysfunction of lumbar region: Secondary | ICD-10-CM | POA: Diagnosis not present

## 2024-09-03 DIAGNOSIS — I10 Essential (primary) hypertension: Secondary | ICD-10-CM | POA: Diagnosis not present

## 2024-09-18 DIAGNOSIS — E871 Hypo-osmolality and hyponatremia: Secondary | ICD-10-CM | POA: Diagnosis not present
